# Patient Record
Sex: Female | Born: 1935
Health system: Southern US, Community
[De-identification: ages and names within clinical notes are randomized; demographics above are authoritative.]

## PROBLEM LIST (undated history)

## (undated) DIAGNOSIS — I82409 Acute embolism and thrombosis of unspecified deep veins of unspecified lower extremity: Secondary | ICD-10-CM

## (undated) DIAGNOSIS — R221 Localized swelling, mass and lump, neck: Secondary | ICD-10-CM

## (undated) DIAGNOSIS — M1990 Unspecified osteoarthritis, unspecified site: Secondary | ICD-10-CM

## (undated) DIAGNOSIS — E785 Hyperlipidemia, unspecified: Secondary | ICD-10-CM

## (undated) DIAGNOSIS — I10 Essential (primary) hypertension: Secondary | ICD-10-CM

## (undated) DIAGNOSIS — F0390 Unspecified dementia without behavioral disturbance: Secondary | ICD-10-CM

## (undated) DIAGNOSIS — E119 Type 2 diabetes mellitus without complications: Secondary | ICD-10-CM

## (undated) DIAGNOSIS — Z87898 Personal history of other specified conditions: Secondary | ICD-10-CM

## (undated) HISTORY — DX: Unspecified osteoarthritis, unspecified site: M19.90

## (undated) HISTORY — DX: Type 2 diabetes mellitus without complications: E11.9

## (undated) HISTORY — DX: Hyperlipidemia, unspecified: E78.5

## (undated) HISTORY — DX: Acute embolism and thrombosis of unspecified deep veins of unspecified lower extremity: I82.409

## (undated) HISTORY — DX: Essential (primary) hypertension: I10

---

## 1972-12-13 ENCOUNTER — Encounter (INDEPENDENT_AMBULATORY_CARE_PROVIDER_SITE_OTHER): Payer: Self-pay | Admitting: Internal Medicine

## 1999-07-14 LAB — HM MAMMOGRAPHY

## 1999-11-13 ENCOUNTER — Encounter (INDEPENDENT_AMBULATORY_CARE_PROVIDER_SITE_OTHER): Payer: Self-pay | Admitting: Internal Medicine

## 2001-11-12 ENCOUNTER — Encounter (INDEPENDENT_AMBULATORY_CARE_PROVIDER_SITE_OTHER): Payer: Self-pay | Admitting: Internal Medicine

## 2001-11-12 LAB — CONVERTED CEMR LAB: Hgb A1c MFr Bld: 5.1 %

## 2002-01-02 LAB — FECAL OCCULT BLOOD, GUAIAC: Fecal Occult Blood: NEGATIVE

## 2003-06-13 ENCOUNTER — Encounter (INDEPENDENT_AMBULATORY_CARE_PROVIDER_SITE_OTHER): Payer: Self-pay | Admitting: Internal Medicine

## 2003-06-13 LAB — CONVERTED CEMR LAB: Microalbumin U total vol: 23.2 mg/L

## 2004-01-11 ENCOUNTER — Encounter (INDEPENDENT_AMBULATORY_CARE_PROVIDER_SITE_OTHER): Payer: Self-pay | Admitting: Internal Medicine

## 2004-01-11 LAB — CONVERTED CEMR LAB: Hgb A1c MFr Bld: 5.8 %

## 2004-08-12 ENCOUNTER — Encounter (INDEPENDENT_AMBULATORY_CARE_PROVIDER_SITE_OTHER): Payer: Self-pay | Admitting: Internal Medicine

## 2004-08-12 LAB — CONVERTED CEMR LAB: Hgb A1c MFr Bld: 5.9 %

## 2004-09-29 ENCOUNTER — Ambulatory Visit: Payer: Self-pay | Admitting: Family Medicine

## 2004-11-12 ENCOUNTER — Encounter (INDEPENDENT_AMBULATORY_CARE_PROVIDER_SITE_OTHER): Payer: Self-pay | Admitting: Internal Medicine

## 2004-11-12 DIAGNOSIS — I82409 Acute embolism and thrombosis of unspecified deep veins of unspecified lower extremity: Secondary | ICD-10-CM

## 2004-11-12 HISTORY — DX: Acute embolism and thrombosis of unspecified deep veins of unspecified lower extremity: I82.409

## 2004-11-17 ENCOUNTER — Ambulatory Visit: Payer: Self-pay | Admitting: Internal Medicine

## 2004-11-20 ENCOUNTER — Emergency Department (HOSPITAL_COMMUNITY): Admission: EM | Admit: 2004-11-20 | Discharge: 2004-11-20 | Payer: Self-pay | Admitting: Emergency Medicine

## 2004-11-22 ENCOUNTER — Ambulatory Visit: Payer: Self-pay | Admitting: Family Medicine

## 2004-12-03 HISTORY — PX: OTHER SURGICAL HISTORY: SHX169

## 2004-12-04 ENCOUNTER — Ambulatory Visit: Payer: Self-pay | Admitting: Infectious Diseases

## 2004-12-04 ENCOUNTER — Ambulatory Visit: Payer: Self-pay | Admitting: Internal Medicine

## 2004-12-04 ENCOUNTER — Inpatient Hospital Stay (HOSPITAL_COMMUNITY): Admission: EM | Admit: 2004-12-04 | Discharge: 2004-12-27 | Payer: Self-pay | Admitting: Emergency Medicine

## 2004-12-04 ENCOUNTER — Ambulatory Visit: Payer: Self-pay | Admitting: Gastroenterology

## 2004-12-22 ENCOUNTER — Ambulatory Visit: Payer: Self-pay | Admitting: Internal Medicine

## 2005-01-05 ENCOUNTER — Ambulatory Visit: Payer: Self-pay | Admitting: Family Medicine

## 2005-01-10 ENCOUNTER — Ambulatory Visit: Payer: Self-pay | Admitting: Family Medicine

## 2005-01-19 ENCOUNTER — Ambulatory Visit: Payer: Self-pay | Admitting: Family Medicine

## 2005-02-02 ENCOUNTER — Ambulatory Visit: Payer: Self-pay | Admitting: Family Medicine

## 2005-03-02 ENCOUNTER — Ambulatory Visit: Payer: Self-pay | Admitting: Family Medicine

## 2005-03-06 ENCOUNTER — Ambulatory Visit: Payer: Self-pay | Admitting: Internal Medicine

## 2005-03-30 ENCOUNTER — Ambulatory Visit: Payer: Self-pay | Admitting: Family Medicine

## 2005-04-27 ENCOUNTER — Ambulatory Visit: Payer: Self-pay | Admitting: Family Medicine

## 2005-05-11 ENCOUNTER — Ambulatory Visit: Payer: Self-pay | Admitting: Family Medicine

## 2005-05-16 IMAGING — CR DG CHEST 1V PORT
1 series · 1 of 1 positions shown · non-contrast
Comparison: 12/03/04.

CLINICAL DATA: PICC line placement.  Hypertension.  
 PORTABLE CHEST:

[view not recorded]
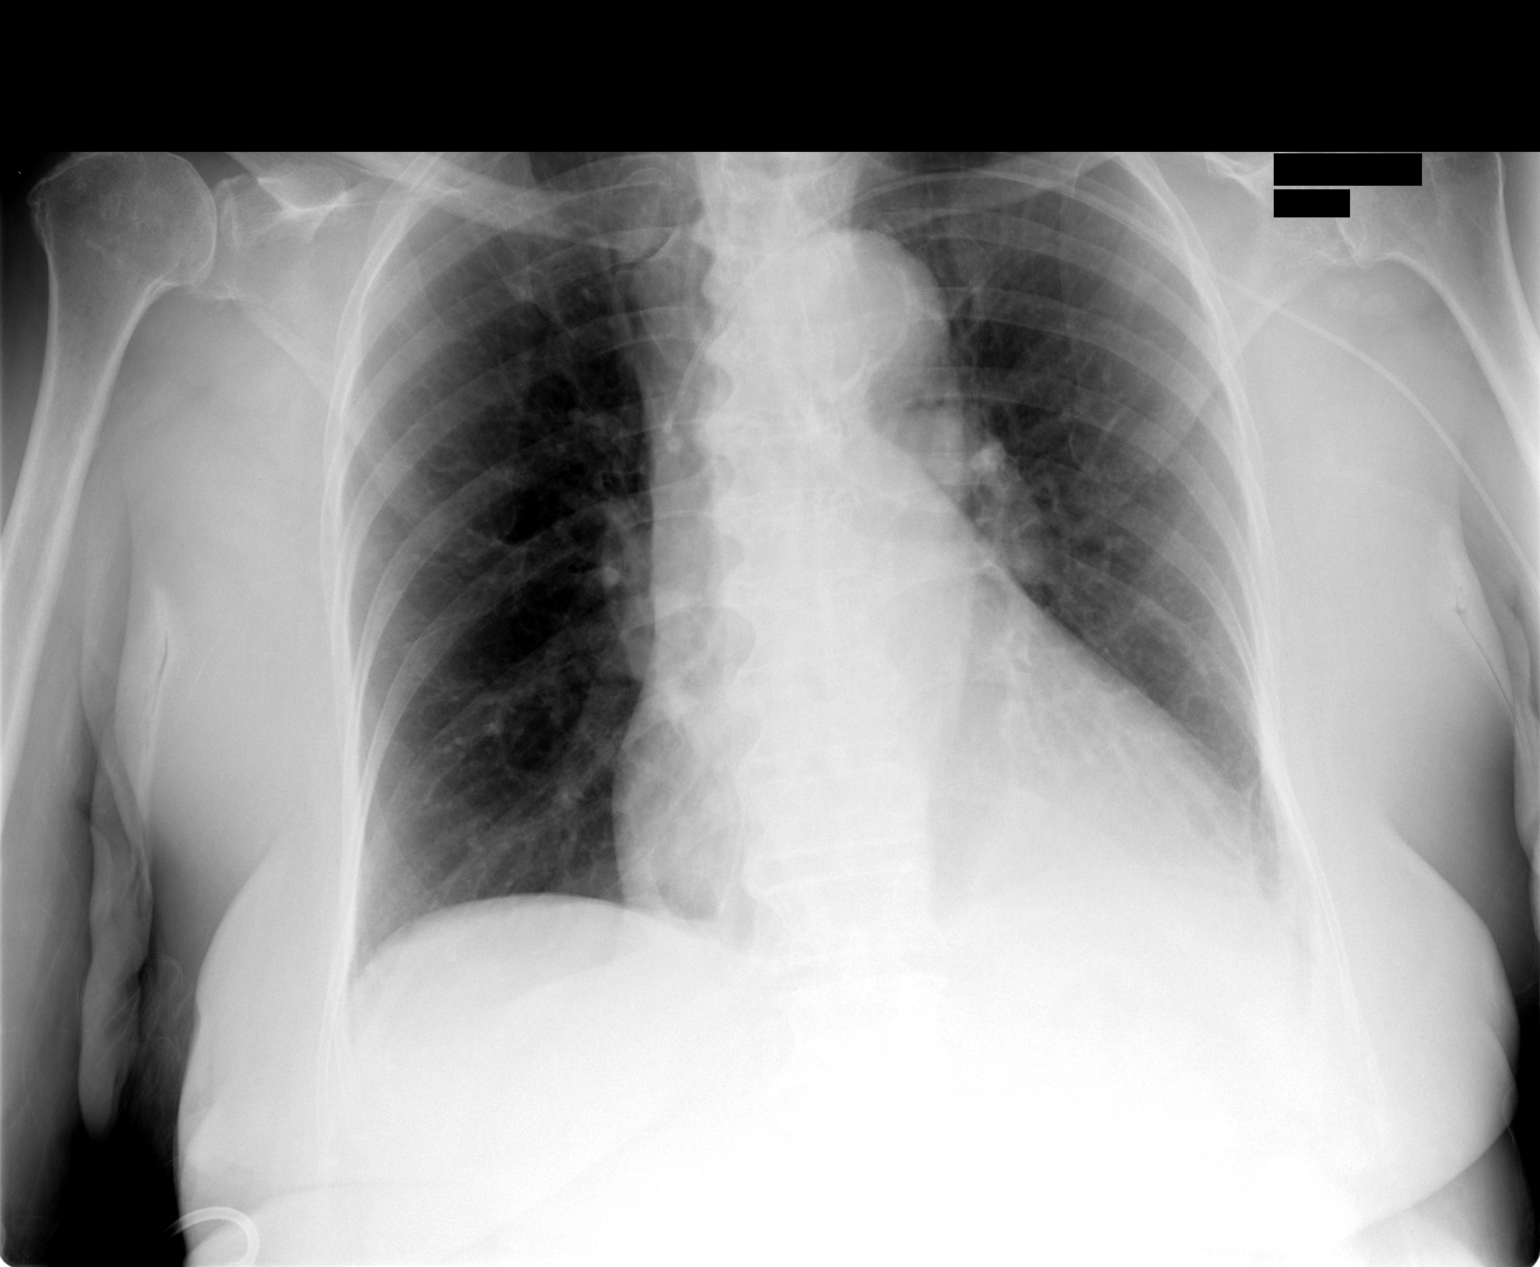

[1 of 1 positions shown; findings below may reference images not displayed]

Left-sided PICC line noted with tip overlying the upper SVC.  Cardiomegaly and left basilar atelectasis/airspace disease noted.  Lungs otherwise clear.  Degenerative changes of the shoulder are stable.
IMPRESSION: 1.  Left-sided PICC line.  Tip overlying the upper SVC.
 2.  Mild left basilar atelectasis/airspace disease.

## 2005-05-22 IMAGING — CT CT ABDOMEN W/ CM
1 of 2 series · 15 of 32 positions shown, 19 images · IV contrast (GASTROGRAFIN & [ID] OMNI 300)
Comparison: 12/11/2003.

CLINICAL DATA: Assess liver abscess.
TECHNIQUE: CT scan of the abdomen and pelvis with intravenous and oral contrast.  100 cc Omnipaque 300 was utilized.

[Series 2: routine abdomen · axial · 0.70mm/px · z∈[-420,-65]mm · 15 of 79 slices shown, 19 images]
[im 4/79  soft-tissue]
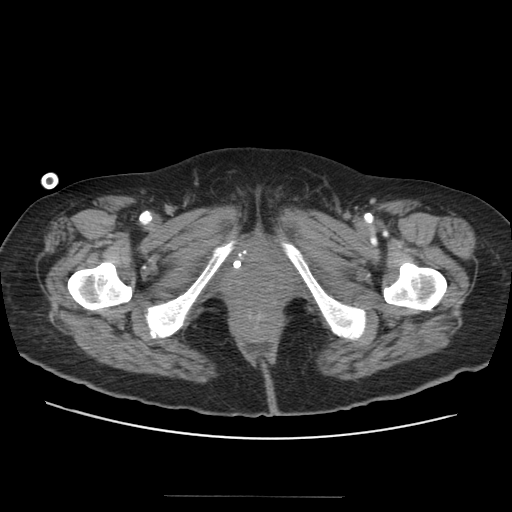
[im 4/79  bone]
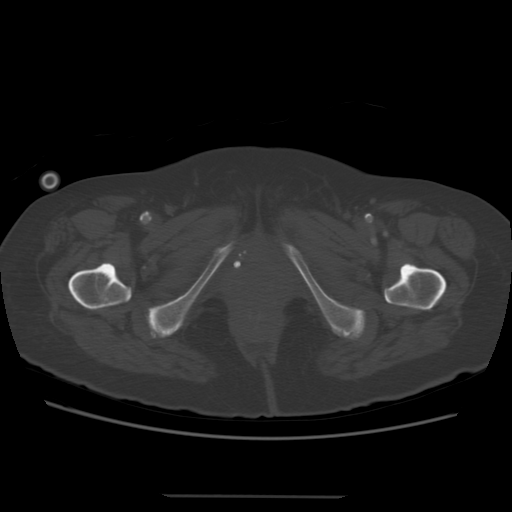
[im 11/79  soft-tissue]
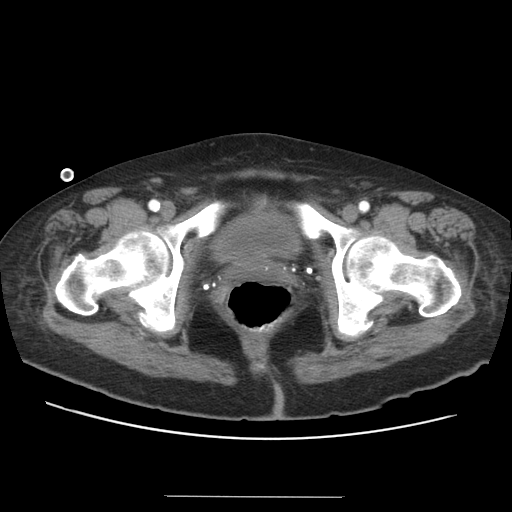
[im 17/79  soft-tissue]
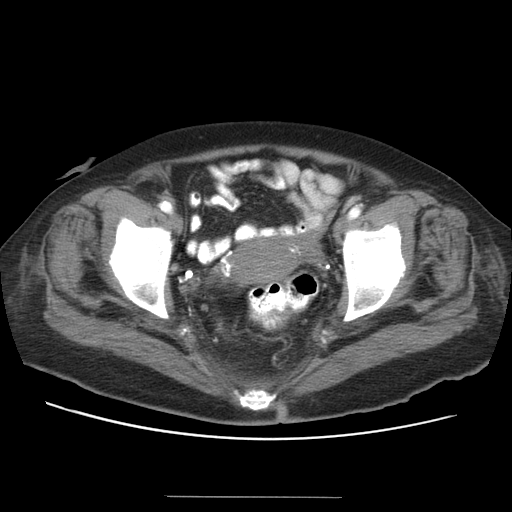
[im 21/79  soft-tissue]
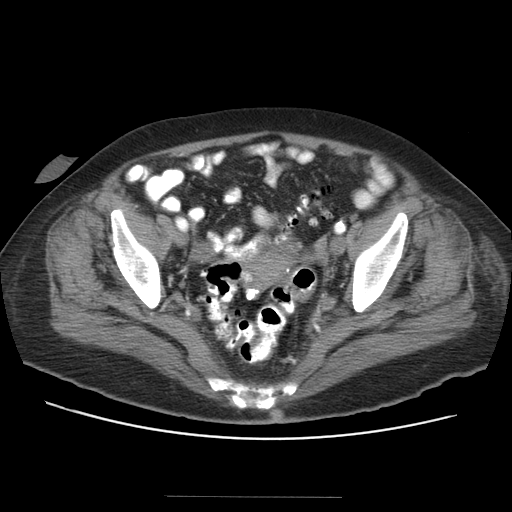
[im 28/79  soft-tissue]
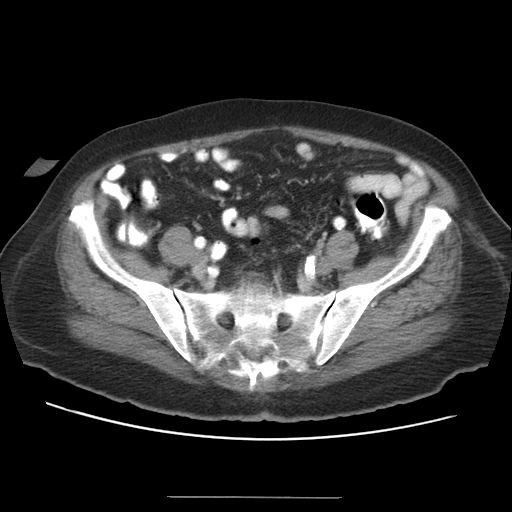
[im 34/79  soft-tissue]
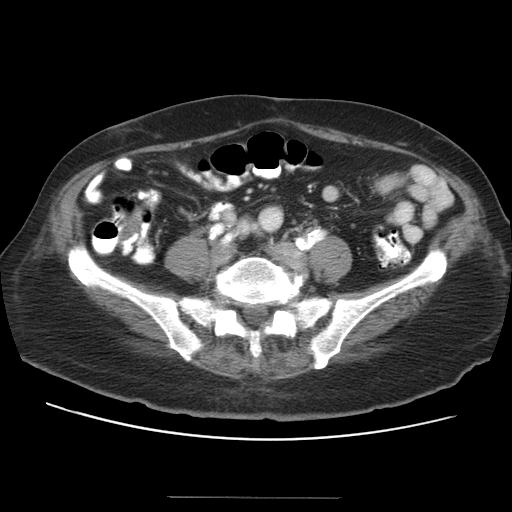
[im 41/79  soft-tissue]
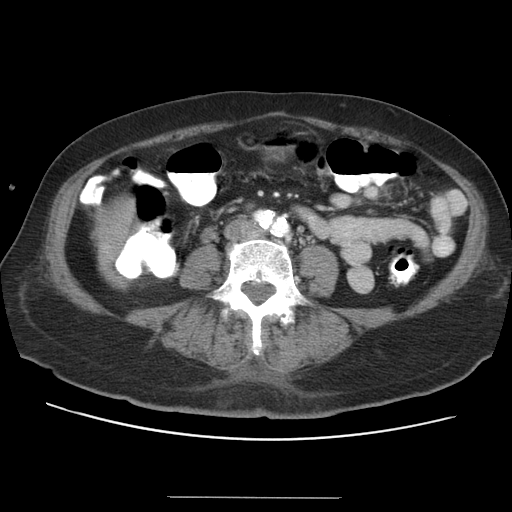
[im 45/79  soft-tissue]
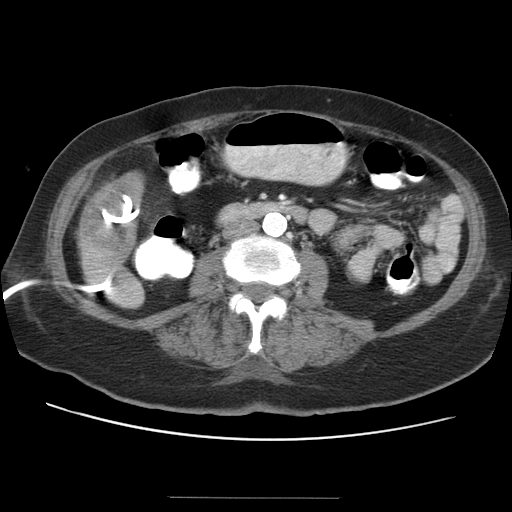
[im 51/79  soft-tissue]
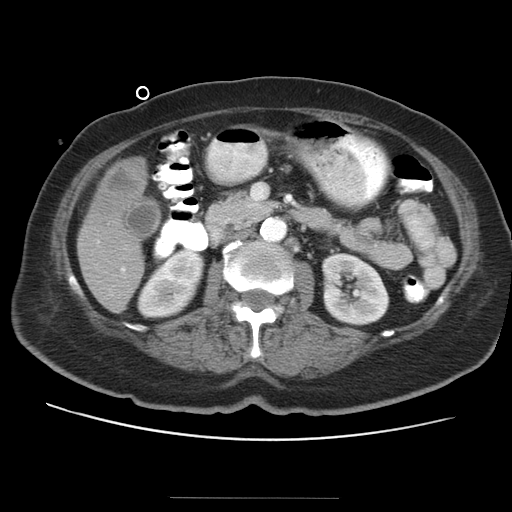
[im 51/79  bone]
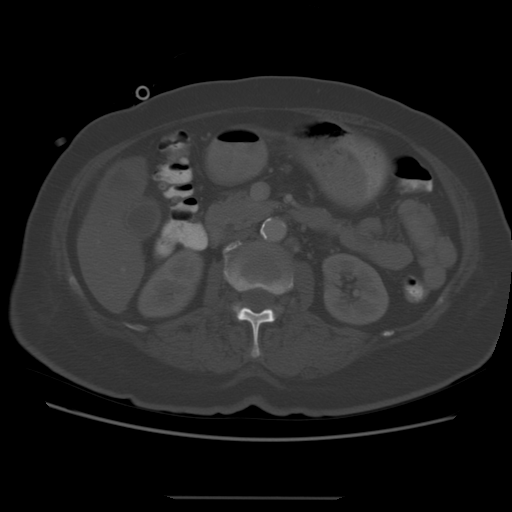
[im 58/79  soft-tissue]
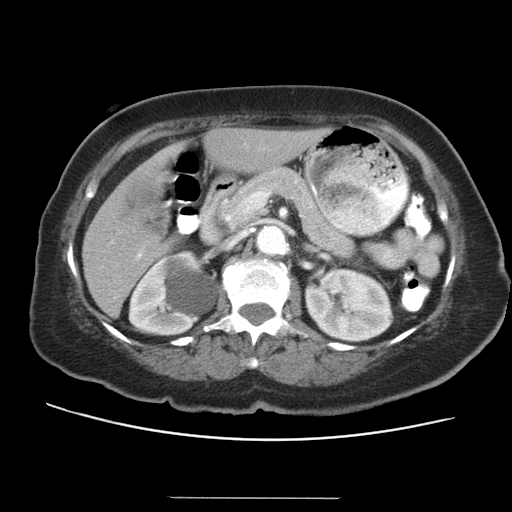
[im 62/79  soft-tissue]
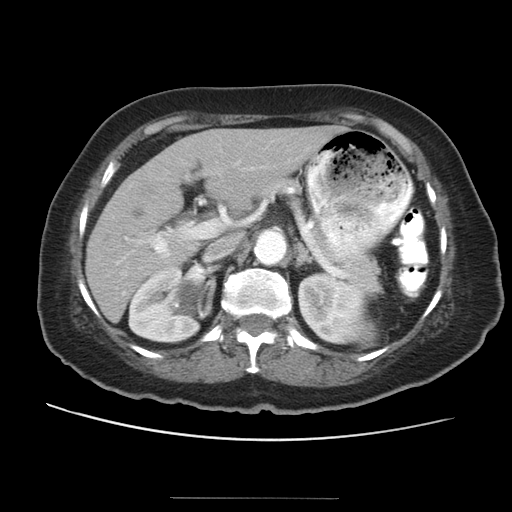
[im 65/79  lung]
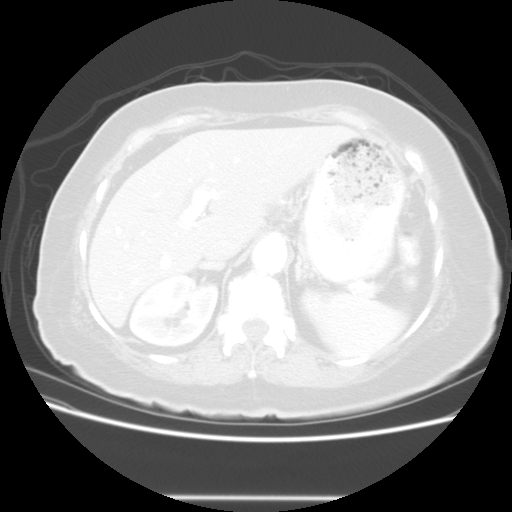
[im 68/79  soft-tissue]
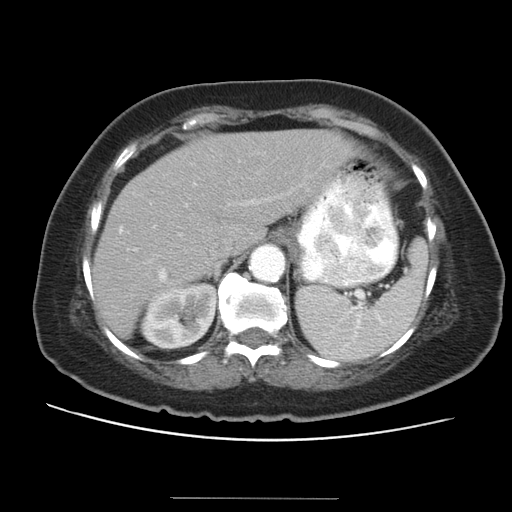
[im 68/79  lung]
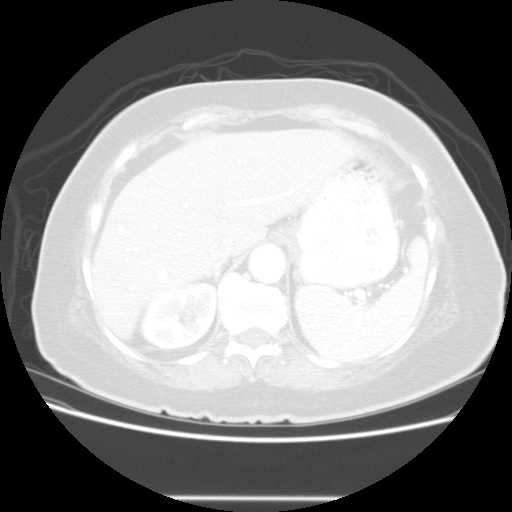
[im 72/79  lung]
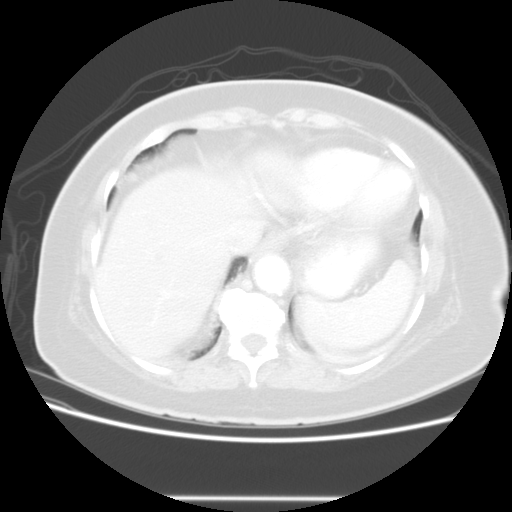
[im 75/79  soft-tissue]
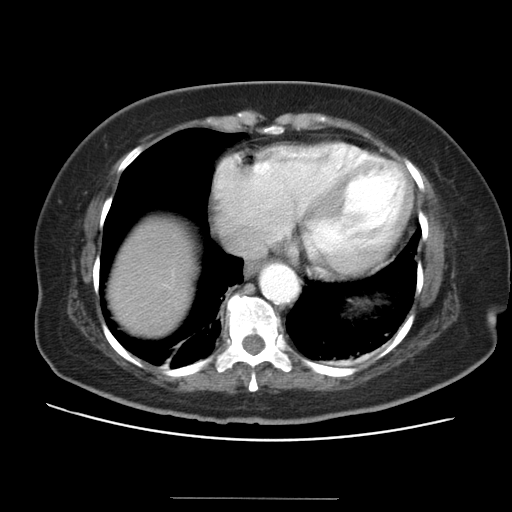
[im 75/79  lung]
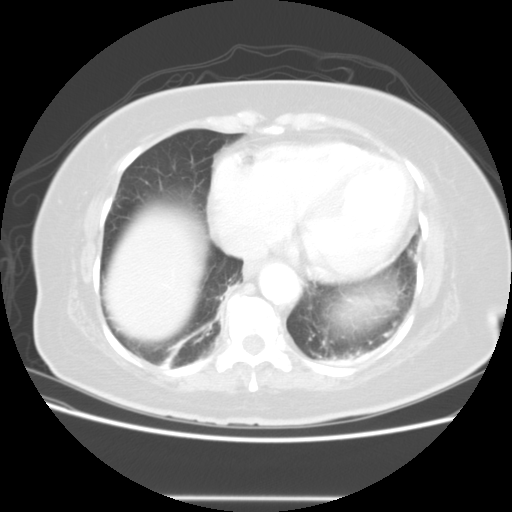

[15 of 32 positions shown; findings below may reference images not displayed]

ABDOMEN CT WITH CONTRAST:
A pigtail catheter has entered a complex cystic structure along the inferior aspect of the right lobe of the liver.  The hypodensity remaining within the inferior aspect of the right lobe of the liver appears to have slightly less volume with overall dimensions presently 7.4 x 4.2 cm versus prior 7.6 x 5.2 cm.  Within this region, fluid and air collection anteriorly now measures 2.2 x 2 cm whereas previously this predominantly contained fluid and measured 3.4 x 2.2 cm. Remainder of findings without change.
IMPRESSION: Slight decrease in size of complex fluid collection along the inferior aspect of the right lobe of the liver as described above.  
PELVIS CT WITH CONTRAST: 
No significant change.  There remains significant sigmoid diverticulosis type changes without CT evidence of diverticulitis.
IMPRESSION: No significant change.

## 2005-06-08 ENCOUNTER — Ambulatory Visit: Payer: Self-pay | Admitting: Family Medicine

## 2005-06-22 ENCOUNTER — Ambulatory Visit: Payer: Self-pay | Admitting: Family Medicine

## 2005-07-06 ENCOUNTER — Ambulatory Visit: Payer: Self-pay | Admitting: Family Medicine

## 2005-08-03 ENCOUNTER — Ambulatory Visit: Payer: Self-pay | Admitting: Family Medicine

## 2005-08-10 ENCOUNTER — Ambulatory Visit: Payer: Self-pay | Admitting: Family Medicine

## 2005-08-24 ENCOUNTER — Ambulatory Visit: Payer: Self-pay | Admitting: Family Medicine

## 2005-09-21 ENCOUNTER — Ambulatory Visit: Payer: Self-pay | Admitting: Family Medicine

## 2005-10-19 ENCOUNTER — Ambulatory Visit: Payer: Self-pay | Admitting: Family Medicine

## 2005-11-16 ENCOUNTER — Ambulatory Visit: Payer: Self-pay | Admitting: Family Medicine

## 2005-12-14 ENCOUNTER — Ambulatory Visit: Payer: Self-pay | Admitting: Family Medicine

## 2005-12-21 ENCOUNTER — Ambulatory Visit: Payer: Self-pay | Admitting: Family Medicine

## 2006-01-04 ENCOUNTER — Ambulatory Visit: Payer: Self-pay | Admitting: Family Medicine

## 2006-01-18 ENCOUNTER — Ambulatory Visit: Payer: Self-pay | Admitting: Family Medicine

## 2006-02-18 ENCOUNTER — Ambulatory Visit: Payer: Self-pay | Admitting: Family Medicine

## 2006-03-18 ENCOUNTER — Ambulatory Visit: Payer: Self-pay | Admitting: Family Medicine

## 2006-04-12 DIAGNOSIS — K5730 Diverticulosis of large intestine without perforation or abscess without bleeding: Secondary | ICD-10-CM | POA: Insufficient documentation

## 2006-04-12 DIAGNOSIS — K449 Diaphragmatic hernia without obstruction or gangrene: Secondary | ICD-10-CM | POA: Insufficient documentation

## 2006-04-12 HISTORY — PX: ESOPHAGOGASTRODUODENOSCOPY: SHX1529

## 2006-04-15 ENCOUNTER — Ambulatory Visit: Payer: Self-pay | Admitting: Family Medicine

## 2006-05-01 ENCOUNTER — Ambulatory Visit: Payer: Self-pay | Admitting: Family Medicine

## 2006-05-02 ENCOUNTER — Ambulatory Visit: Payer: Self-pay | Admitting: Internal Medicine

## 2006-05-06 ENCOUNTER — Ambulatory Visit: Payer: Self-pay | Admitting: Internal Medicine

## 2006-05-06 ENCOUNTER — Encounter (INDEPENDENT_AMBULATORY_CARE_PROVIDER_SITE_OTHER): Payer: Self-pay | Admitting: Specialist

## 2006-05-16 ENCOUNTER — Ambulatory Visit: Payer: Self-pay | Admitting: Family Medicine

## 2006-06-28 ENCOUNTER — Ambulatory Visit: Payer: Self-pay | Admitting: Family Medicine

## 2006-07-30 ENCOUNTER — Ambulatory Visit: Payer: Self-pay | Admitting: Family Medicine

## 2006-08-29 ENCOUNTER — Ambulatory Visit: Payer: Self-pay | Admitting: Family Medicine

## 2006-09-30 ENCOUNTER — Ambulatory Visit: Payer: Self-pay | Admitting: Family Medicine

## 2007-01-03 ENCOUNTER — Ambulatory Visit: Payer: Self-pay | Admitting: Family Medicine

## 2007-01-03 LAB — CONVERTED CEMR LAB
BUN: 14 mg/dL (ref 6–23)
Creatinine,U: 145.8 mg/dL
GFR calc Af Amer: 122 mL/min
Microalb, Ur: 1.2 mg/dL (ref 0.0–1.9)
Potassium: 3.9 meq/L (ref 3.5–5.1)
Total CHOL/HDL Ratio: 2.7
Triglycerides: 71 mg/dL (ref 0–149)
VLDL: 14 mg/dL (ref 0–40)

## 2007-02-11 ENCOUNTER — Encounter (INDEPENDENT_AMBULATORY_CARE_PROVIDER_SITE_OTHER): Payer: Self-pay | Admitting: Internal Medicine

## 2007-02-11 DIAGNOSIS — M1710 Unilateral primary osteoarthritis, unspecified knee: Secondary | ICD-10-CM

## 2007-02-11 DIAGNOSIS — E785 Hyperlipidemia, unspecified: Secondary | ICD-10-CM | POA: Insufficient documentation

## 2007-02-11 DIAGNOSIS — F411 Generalized anxiety disorder: Secondary | ICD-10-CM | POA: Insufficient documentation

## 2007-02-11 DIAGNOSIS — I10 Essential (primary) hypertension: Secondary | ICD-10-CM

## 2007-04-03 ENCOUNTER — Ambulatory Visit: Payer: Self-pay | Admitting: Family Medicine

## 2007-08-04 ENCOUNTER — Ambulatory Visit: Payer: Self-pay | Admitting: Family Medicine

## 2007-08-07 ENCOUNTER — Ambulatory Visit: Payer: Self-pay | Admitting: Family Medicine

## 2007-08-12 LAB — CONVERTED CEMR LAB
Calcium: 9.5 mg/dL (ref 8.4–10.5)
Chloride: 107 meq/L (ref 96–112)
GFR calc Af Amer: 87 mL/min
GFR calc non Af Amer: 72 mL/min
HDL: 75.6 mg/dL (ref >39.0)
Hgb A1c MFr Bld: 6.5 % — ABNORMAL HIGH (ref 4.6–6.0)
LDL Cholesterol: 98 mg/dL (ref 0–99)
Sodium: 145 meq/L (ref 135–145)
VLDL: 9 mg/dL (ref 0–40)

## 2007-09-08 ENCOUNTER — Ambulatory Visit: Payer: Self-pay | Admitting: Family Medicine

## 2007-11-11 ENCOUNTER — Ambulatory Visit: Payer: Self-pay | Admitting: Family Medicine

## 2007-11-19 ENCOUNTER — Ambulatory Visit: Payer: Self-pay | Admitting: Family Medicine

## 2007-12-22 ENCOUNTER — Ambulatory Visit: Payer: Self-pay | Admitting: Family Medicine

## 2007-12-25 ENCOUNTER — Ambulatory Visit: Payer: Self-pay | Admitting: Family Medicine

## 2007-12-29 LAB — CONVERTED CEMR LAB
ALT: 12 units/L (ref 0–35)
AST: 16 units/L (ref 0–37)
BUN: 31 mg/dL — ABNORMAL HIGH (ref 6–23)
Calcium: 9.6 mg/dL (ref 8.4–10.5)
Chloride: 108 meq/L (ref 96–112)
Cholesterol: 155 mg/dL (ref 0–200)
GFR calc non Af Amer: 25 mL/min
Glucose, Bld: 126 mg/dL — ABNORMAL HIGH (ref 70–99)
Hgb A1c MFr Bld: 6.3 % — ABNORMAL HIGH (ref 4.6–6.0)
LDL Cholesterol: 85 mg/dL (ref 0–99)

## 2008-03-29 ENCOUNTER — Ambulatory Visit: Payer: Self-pay | Admitting: Family Medicine

## 2008-03-29 DIAGNOSIS — N259 Disorder resulting from impaired renal tubular function, unspecified: Secondary | ICD-10-CM | POA: Insufficient documentation

## 2008-04-01 ENCOUNTER — Telehealth (INDEPENDENT_AMBULATORY_CARE_PROVIDER_SITE_OTHER): Payer: Self-pay | Admitting: Internal Medicine

## 2008-04-06 ENCOUNTER — Telehealth (INDEPENDENT_AMBULATORY_CARE_PROVIDER_SITE_OTHER): Payer: Self-pay | Admitting: Internal Medicine

## 2008-04-15 ENCOUNTER — Telehealth (INDEPENDENT_AMBULATORY_CARE_PROVIDER_SITE_OTHER): Payer: Self-pay | Admitting: Internal Medicine

## 2008-04-16 ENCOUNTER — Ambulatory Visit: Payer: Self-pay | Admitting: Internal Medicine

## 2008-04-20 ENCOUNTER — Telehealth (INDEPENDENT_AMBULATORY_CARE_PROVIDER_SITE_OTHER): Payer: Self-pay | Admitting: Internal Medicine

## 2008-04-20 LAB — CONVERTED CEMR LAB
CO2: 29 meq/L (ref 19–32)
Calcium: 9.3 mg/dL (ref 8.4–10.5)
Creatinine, Ser: 1.3 mg/dL — ABNORMAL HIGH (ref 0.4–1.2)
GFR calc Af Amer: 50 mL/min
GFR calc non Af Amer: 41 mL/min
Sodium: 139 meq/L (ref 135–145)

## 2008-07-01 ENCOUNTER — Ambulatory Visit: Payer: Self-pay | Admitting: Family Medicine

## 2008-07-30 ENCOUNTER — Telehealth (INDEPENDENT_AMBULATORY_CARE_PROVIDER_SITE_OTHER): Payer: Self-pay | Admitting: Internal Medicine

## 2008-09-22 ENCOUNTER — Ambulatory Visit: Payer: Self-pay | Admitting: Family Medicine

## 2008-09-23 LAB — CONVERTED CEMR LAB
AST: 29 units/L (ref 0–37)
BUN: 15 mg/dL (ref 6–23)
CO2: 30 meq/L (ref 19–32)
Chloride: 105 meq/L (ref 96–112)
Cholesterol: 164 mg/dL (ref 0–200)
Creatinine, Ser: 1.1 mg/dL (ref 0.4–1.2)
Glucose, Bld: 133 mg/dL — ABNORMAL HIGH (ref 70–99)
Hgb A1c MFr Bld: 6.1 % — ABNORMAL HIGH (ref 4.6–6.0)
Microalb Creat Ratio: 9.7 mg/g (ref 0.0–30.0)
Potassium: 3.7 meq/L (ref 3.5–5.1)
Triglycerides: 40 mg/dL (ref 0–149)

## 2008-09-29 ENCOUNTER — Ambulatory Visit: Payer: Self-pay | Admitting: Family Medicine

## 2009-01-25 ENCOUNTER — Ambulatory Visit: Payer: Self-pay | Admitting: Family Medicine

## 2009-01-28 LAB — CONVERTED CEMR LAB
BUN: 17 mg/dL (ref 6–23)
Calcium: 9.5 mg/dL (ref 8.4–10.5)
Creatinine,U: 74.2 mg/dL
GFR calc non Af Amer: 60.02 mL/min (ref >60)
Hgb A1c MFr Bld: 6.4 % (ref 4.6–6.5)
Microalb Creat Ratio: 2.7 mg/g (ref 0.0–30.0)
Potassium: 3.5 meq/L (ref 3.5–5.1)
Sodium: 142 meq/L (ref 135–145)

## 2009-05-23 ENCOUNTER — Telehealth: Payer: Self-pay | Admitting: Family Medicine

## 2009-05-31 ENCOUNTER — Ambulatory Visit: Payer: Self-pay | Admitting: Family Medicine

## 2009-06-02 LAB — CONVERTED CEMR LAB
ALT: 20 units/L (ref 0–35)
AST: 28 units/L (ref 0–37)
Calcium: 9.5 mg/dL (ref 8.4–10.5)
GFR calc non Af Amer: 66.95 mL/min (ref >60)
Glucose, Bld: 121 mg/dL — ABNORMAL HIGH (ref 70–99)
HDL: 80.9 mg/dL (ref >39.00)
Potassium: 3.5 meq/L (ref 3.5–5.1)
Sodium: 144 meq/L (ref 135–145)
Total CHOL/HDL Ratio: 2
VLDL: 14.8 mg/dL (ref 0.0–40.0)

## 2009-10-04 ENCOUNTER — Ambulatory Visit: Payer: Self-pay | Admitting: Family Medicine

## 2009-10-11 ENCOUNTER — Ambulatory Visit: Payer: Self-pay | Admitting: Family Medicine

## 2009-10-13 LAB — CONVERTED CEMR LAB
ALT: 19 units/L (ref 0–35)
AST: 26 units/L (ref 0–37)
CO2: 31 meq/L (ref 19–32)
Calcium: 9.4 mg/dL (ref 8.4–10.5)
Cholesterol: 179 mg/dL (ref 0–200)
Creatinine, Ser: 1.1 mg/dL (ref 0.4–1.2)
GFR calc non Af Amer: 59.93 mL/min (ref >60)
Hgb A1c MFr Bld: 6.3 % (ref 4.6–6.5)
Sodium: 143 meq/L (ref 135–145)

## 2009-11-18 ENCOUNTER — Telehealth (INDEPENDENT_AMBULATORY_CARE_PROVIDER_SITE_OTHER): Payer: Self-pay | Admitting: Internal Medicine

## 2010-03-16 ENCOUNTER — Telehealth: Payer: Self-pay | Admitting: Family Medicine

## 2010-03-28 ENCOUNTER — Ambulatory Visit: Payer: Self-pay | Admitting: Internal Medicine

## 2010-03-29 LAB — CONVERTED CEMR LAB
AST: 29 units/L (ref 0–37)
Albumin: 4.3 g/dL (ref 3.5–5.2)
BUN: 17 mg/dL (ref 6–23)
Calcium: 9.7 mg/dL (ref 8.4–10.5)
Cholesterol: 175 mg/dL (ref 0–200)
Creatinine, Ser: 1 mg/dL (ref 0.4–1.2)
GFR calc non Af Amer: 66.83 mL/min (ref >60)
Glucose, Bld: 124 mg/dL — ABNORMAL HIGH (ref 70–99)
HDL: 84.5 mg/dL (ref >39.00)
Hgb A1c MFr Bld: 6.3 % (ref 4.6–6.5)
LDL Cholesterol: 73 mg/dL (ref 0–99)
Potassium: 3.9 meq/L (ref 3.5–5.1)
Total Bilirubin: 0.8 mg/dL (ref 0.3–1.2)
Triglycerides: 86 mg/dL (ref 0.0–149.0)
VLDL: 17.2 mg/dL (ref 0.0–40.0)

## 2010-04-04 ENCOUNTER — Ambulatory Visit: Payer: Self-pay | Admitting: Internal Medicine

## 2010-04-04 DIAGNOSIS — M1990 Unspecified osteoarthritis, unspecified site: Secondary | ICD-10-CM | POA: Insufficient documentation

## 2010-04-04 DIAGNOSIS — E119 Type 2 diabetes mellitus without complications: Secondary | ICD-10-CM | POA: Insufficient documentation

## 2010-04-04 DIAGNOSIS — F0150 Vascular dementia without behavioral disturbance: Secondary | ICD-10-CM

## 2010-04-21 ENCOUNTER — Telehealth: Payer: Self-pay | Admitting: Internal Medicine

## 2010-05-29 ENCOUNTER — Telehealth (INDEPENDENT_AMBULATORY_CARE_PROVIDER_SITE_OTHER): Payer: Self-pay | Admitting: *Deleted

## 2010-07-31 ENCOUNTER — Ambulatory Visit: Payer: Self-pay | Admitting: Internal Medicine

## 2010-07-31 ENCOUNTER — Telehealth (INDEPENDENT_AMBULATORY_CARE_PROVIDER_SITE_OTHER): Payer: Self-pay | Admitting: *Deleted

## 2010-09-28 ENCOUNTER — Telehealth: Payer: Self-pay | Admitting: Internal Medicine

## 2010-11-21 ENCOUNTER — Ambulatory Visit
Admission: RE | Admit: 2010-11-21 | Discharge: 2010-11-21 | Payer: Self-pay | Source: Home / Self Care | Attending: Internal Medicine | Admitting: Internal Medicine

## 2010-11-21 ENCOUNTER — Other Ambulatory Visit: Payer: Self-pay | Admitting: Internal Medicine

## 2010-11-21 LAB — HEPATIC FUNCTION PANEL
ALT: 16 U/L (ref 0–35)
AST: 26 U/L (ref 0–37)
Albumin: 4 g/dL (ref 3.5–5.2)
Alkaline Phosphatase: 57 U/L (ref 39–117)
Bilirubin, Direct: 0.2 mg/dL (ref 0.0–0.3)
Total Bilirubin: 0.9 mg/dL (ref 0.3–1.2)
Total Protein: 7.2 g/dL (ref 6.0–8.3)

## 2010-11-21 LAB — CBC WITH DIFFERENTIAL/PLATELET
Basophils Absolute: 0.1 10*3/uL (ref 0.0–0.1)
Basophils Relative: 1.6 % (ref 0.0–3.0)
Eosinophils Absolute: 0.1 10*3/uL (ref 0.0–0.7)
Eosinophils Relative: 0.9 % (ref 0.0–5.0)
HCT: 39.8 % (ref 36.0–46.0)
Hemoglobin: 13.5 g/dL (ref 12.0–15.0)
Lymphocytes Relative: 24.1 % (ref 12.0–46.0)
Lymphs Abs: 1.7 10*3/uL (ref 0.7–4.0)
MCHC: 34 g/dL (ref 30.0–36.0)
MCV: 96.1 fl (ref 78.0–100.0)
Monocytes Absolute: 0.7 10*3/uL (ref 0.1–1.0)
Monocytes Relative: 9.3 % (ref 3.0–12.0)
Neutro Abs: 4.6 10*3/uL (ref 1.4–7.7)
Neutrophils Relative %: 64.1 % (ref 43.0–77.0)
Platelets: 161 10*3/uL (ref 150.0–400.0)
RBC: 4.14 Mil/uL (ref 3.87–5.11)
RDW: 16.3 % — ABNORMAL HIGH (ref 11.5–14.6)
WBC: 7.1 10*3/uL (ref 4.5–10.5)

## 2010-11-21 LAB — RENAL FUNCTION PANEL
Albumin: 4 g/dL (ref 3.5–5.2)
BUN: 21 mg/dL (ref 6–23)
CO2: 30 mEq/L (ref 19–32)
Calcium: 9.5 mg/dL (ref 8.4–10.5)
Chloride: 102 mEq/L (ref 96–112)
Creatinine, Ser: 1 mg/dL (ref 0.4–1.2)
GFR: 69.95 mL/min (ref >60.00)
Glucose, Bld: 114 mg/dL — ABNORMAL HIGH (ref 70–99)
Phosphorus: 2.6 mg/dL (ref 2.3–4.6)
Potassium: 3.5 mEq/L (ref 3.5–5.1)
Sodium: 143 mEq/L (ref 135–145)

## 2010-11-21 LAB — TSH: TSH: 1.97 u[IU]/mL (ref 0.35–5.50)

## 2010-11-21 LAB — HM DIABETES FOOT EXAM

## 2010-11-21 LAB — HEMOGLOBIN A1C: Hgb A1c MFr Bld: 6.4 % (ref 4.6–6.5)

## 2010-12-03 ENCOUNTER — Encounter: Payer: Self-pay | Admitting: Interventional Radiology

## 2010-12-13 NOTE — Progress Notes (Signed)
Summary: refill request for tramadol  Phone Note Refill Request Message from:  Fax from Pharmacy  Refills Requested: Medication #1:  TRAMADOL HCL 50 MG TABS 1 tab by mouth three times a day as needed for knee pain   Last Refilled: 05/01/2010 Faxed request from [facility] is on your desk.  Initial call taken by: Lowella Petties CMA, AAMA,  September 28, 2010 11:35 AM    Prescriptions: TRAMADOL HCL 50 MG TABS (TRAMADOL HCL) 1 tab by mouth three times a day as needed for knee pain  #90 Tablet x 0   Entered by:   Mervin Hack CMA (AAMA)   Authorized by:   Cindee Salt MD   Signed by:   Mervin Hack CMA (AAMA) on 09/28/2010   Method used:   [facility]ron[facility] to        [facility] [phone]       [address]       [place], [place]  [address]       Ph: [phone]       Fax: [phone]   RxID:   [id number]

## 2010-12-13 NOTE — Progress Notes (Signed)
Summary: Rx Tramadol  Phone Note Refill Request Call back at [phone] Message from:  [facility] on Mar 16, 2010 8:54 AM  Refills Requested: Medication #1:  TRAMADOL HCL 50 MG TABS 1 every 6 hours as needed pain in knees   Last Refilled: 12/19/2009 Received faxed refill request, patient last seen 09/2009 by Billie.  Patient has an appt with Dr. Alphonsus Sias this month, however Dr. Alphonsus Sias will be out of the office until Monday.  Please advise.   Method Requested: Telephone to Pharmacy Initial call taken by: Linde Gillis CMA Duncan Dull),  Mar 16, 2010 8:55 AM  Follow-up for Phone Call        Rx called to pharmacy Follow-up by: Benny Lennert CMA Duncan Dull),  Mar 16, 2010 9:43 AM    Prescriptions: TRAMADOL HCL 50 MG TABS (TRAMADOL HCL) 1 every 6 hours as needed pain in knees  #50 x 0   Entered and Authorized by:   Hannah Beat MD   Signed by:   Hannah Beat MD on 03/16/2010   Method used:   Telephoned to ...       [facility] [address] [id number]       [address]       [place], [place]  [address]       Ph: [phone]       Fax: [phone]   RxID:   [id number]

## 2010-12-13 NOTE — Progress Notes (Signed)
Summary: Hydralazine clarification  Phone Note Refill Request Call back at [phone] Message from:  [facility] on November 18, 2009 11:32 AM  Refills Requested: Medication #1:  HYDRALAZINE HCL 50 MG TABS Take 1 by mouth two times a day. [facility] refill on Hydralazine 50 mg two tabs twice a day (no date given on last refill) The med list has Hydralazine 50mg  one tablet twice a day. In the notes it looks like on 01/25/09 visit the Hydralazine was changed from 50mg  two tabs twice a day to one tab twice a day. I have tried pt's home # and alternate family # with no answer but left v/m for a call back. Please advise.    Method Requested: Electronic Initial call taken by: Lewanda Rife LPN,  November 18, 2009 11:36 AM  Follow-up for Phone Call        the one the drug store called in is old--needs to cancel that Rx and use new one I sent in just now   Billie-Lynn Tyler Deis FNP  November 18, 2009 3:06 PM   Additional Follow-up for Phone Call Additional follow up Details #1::        Pharmacist filled new script and it has been picked up by the pt's daughter. Additional Follow-up by: Lowella Petties CMA,  November 18, 2009 4:47 PM    Prescriptions: HYDRALAZINE HCL 50 MG TABS (HYDRALAZINE HCL) Take 1 by mouth two times a day  #60 x 6   Entered and Authorized by:   Gildardo Griffes FNP   Signed by:   Gildardo Griffes FNP on 11/18/2009   Method used:   Electronically to        [facility] [address] [phone]* (retail)       [address]       [place], [place]  [address]       Ph: [phone]       Fax: [phone]   RxID:   [id number]

## 2010-12-13 NOTE — Assessment & Plan Note (Signed)
Summary: 4 m f/u dlo   Vital Signs:  Patient profile:   77 year old female Weight:      141 pounds Temp:     97.6 degrees F oral Pulse rate:   80 / minute Pulse rhythm:   regular BP sitting:   138 / 70  (left arm) Cuff size:   regular  Vitals Entered By: Mervin Hack CMA Duncan Dull) (July 31, 2010 10:11 AM) CC: 4 month follow-up   History of Present Illness: Doing well Daughter brought her but didn't come into room  Plans to move in with daughter for the winter She doesn't have the money to buy oil to heat Still cooks herself Daughter helps with cleaning  and laundry  Doesn't check sugars No meds Tries to be careful with eating  No chest pain No SOB No edema  No apparent memory changes  continues to have knee pain Limits her walking Not taking the pain pills  Allergies: No Known Drug Allergies  Past History:  Past medical, surgical, family and social histories (including risk factors) reviewed for relevance to current acute and chronic problems.  Past Medical History: Reviewed history from 04/04/2010 and no changes required. Diabetes mellitus, type II Hyperlipidemia Hypertension Osteoarthritis  Past Surgical History: Reviewed history from 04/04/2010 and no changes required. [facility] - LIVER ABSCESS - STREP WITH STREP SEPSIS ------1/22 - 12/27/04          DVT DURING HOSPITAL 6/07 COLONOSCOPY - DIVERTICS, HEMHDS 6/07 EGD  Family History: Reviewed history from 04/04/2010 and no changes required. Parents died when she was very young 6 brothers---all deceased Not clear about medical history  Social History: Reviewed history from 05/29/2010 and no changes required. Retired--housekeeper at hospital Widowed  ~2001 7 children -- 5 daughters , 2 sons Never Smoked Alcohol use-no  Asks for daughter, Kathie Rhodes, to make health care decisions if she is unable to. Discussed DNR--she requests this but later decided that she wants attempts at resuscitation Probably  would accept feeding tube  Review of Systems       Sleeps well Appetite is fine weight down 5#--she wasn't aware of this. ??related to summer  Physical Exam  General:  alert and normal appearance.   Neck:  supple, no masses, no thyromegaly, no carotid bruits, and no cervical lymphadenopathy.   Lungs:  normal respiratory effort, no intercostal retractions, no accessory muscle use, and normal breath sounds.   Heart:  normal rate, regular rhythm, no murmur, and no gallop.   Abdomen:  soft and non-tender.   Msk:  no joint tenderness and no joint swelling.   Extremities:  No edema Neurologic:  alert & oriented X3 and strength normal in all extremities.   Walks with cane Doesn't know President's didn't try world backwards Psych:  normally interactive, good eye contact, not anxious appearing, and not depressed appearing.     Impression & Recommendations:  Problem # 1:  DEMENTIA (ICD-294.8) Assessment Unchanged seems mild and not worsened will be living with daughter for the winter will reassess functional status at next visit  Problem # 2:  OSTEOARTHRITIS (ICD-715.90) Assessment: Comment Only hasn't been taking the med discussed tylenol regularly  Her updated medication list for this problem includes:    Tramadol Hcl 50 Mg Tabs (Tramadol hcl) .Marland Kitchen... 1 tab by mouth three times a day as needed for knee pain  Problem # 3:  HYPERTENSION (ICD-401.9) Assessment: Unchanged good control no changes needed  Her updated medication list for this problem includes:  Amlodipine Besylate 10 Mg Tabs (Amlodipine besylate) .Marland Kitchen... Take 1 tablet by mouth once a day    Maxzide-25 37.5-25 Mg Tabs (Triamterene-hctz) .Marland Kitchen... 1 by mouth every am for bp    Hydralazine Hcl 50 Mg Tabs (Hydralazine hcl) .Marland Kitchen... Take 1 by mouth two times a day  BP today: 138/70 Prior BP: 130/60 (04/04/2010)  Labs Reviewed: K+: 3.9 (03/28/2010) Creat: : 1.0 (03/28/2010)   Chol: 175 (03/28/2010)   HDL: 84.50 (03/28/2010)    LDL: 73 (03/28/2010)   TG: 86.0 (03/28/2010)  Problem # 4:  DIABETES MELLITUS, TYPE II (ICD-250.00) Assessment: Comment Only no meds will check labs next time  Labs Reviewed: Creat: 1.0 (03/28/2010)   Microalbumin: 23.2 (06/13/2003)  Last Eye Exam: no retinopathy (10/12/2009) Reviewed HgBA1c results: 6.3 (03/28/2010)  6.3 (10/11/2009)  Complete Medication List: 1)  Tramadol Hcl 50 Mg Tabs (Tramadol hcl) .Marland Kitchen.. 1 tab by mouth three times a day as needed for knee pain 2)  Amlodipine Besylate 10 Mg Tabs (Amlodipine besylate) .... Take 1 tablet by mouth once a day 3)  Maxzide-25 37.5-25 Mg Tabs (Triamterene-hctz) .Marland Kitchen.. 1 by mouth every am for bp 4)  Hydralazine Hcl 50 Mg Tabs (Hydralazine hcl) .... Take 1 by mouth two times a day 5)  Cod Liver Oil Oil (Cod liver oil) .Marland Kitchen.. 1 tsp every morning as needed  Patient Instructions: 1)  Please try tylenol for the arthritis in your knees. This is safe and may help your pain. Try 2 of the 325mg  tablets up to four times daily 2)  Please schedule a follow-up appointment in 4 months .  Prescriptions: HYDRALAZINE HCL 50 MG TABS (HYDRALAZINE HCL) Take 1 by mouth two times a day  #60 x 11   Entered and Authorized by:   Cindee Salt MD   Signed by:   Cindee Salt MD on 07/31/2010   Method used:   [facility]        [facility] [phone][facility] (retail)       [address]       [place], [place]  [address]       Ph: [phone]       Fax: [phone]   RxID:   [id number] MAXZIDE-25 37.5-25 MG  TABS (TRIAMTERENE-HCTZ) 1 by mouth every am for BP  #30 Tablet x 11   Entered and Authorized by:   Cindee Salt MD   Signed by:   Cindee Salt MD on 07/31/2010   Method used:   [facility]ronically [facility]        [facility] [address] [phone]* (retail)       [address]       [place], [place]  [address]       Ph: [phone]       Fax: [phone]   RxID:   [id number]   Current  Allergies (reviewed today): No known allergies

## 2010-12-13 NOTE — Progress Notes (Signed)
Summary: DNR- REQUEST TO BE REMOVED PER PT  Phone Note Call from Patient   Caller: Patient Call For: Cindee Salt MD Summary of Call: Pts  grandson called, says pt did not understand the DNR form. I spoke w pt says she remembers  signing  DNR , but she did not know what she was signing, says she wants to live, she wants to be resusitated.  Pt requests  that we remove the DNR  form from her files. Pt also wants to add her grandsons  name on her designated party release form. Told pt she needs to  discuss  request w/ her daughter as well. Says she will do so.   Told pt I would discuss w/ her physician.Marland KitchenMarland KitchenDaine Gip  May 29, 2010 10:52 AM  Initial call taken by: Daine Gip,  May 29, 2010 10:53 AM  Follow-up for Phone Call        will stop the DNR order please let her know I took care of this and rescinded the DNR order we can review this again at her next visit Follow-up by: Cindee Salt MD,  May 29, 2010 11:16 AM  Additional Follow-up for Phone Call Additional follow up Details #1::        Called pt informed her DNR form has been recinded per Dr. Alphonsus Sias. Pt was responded ok, great.Daine Gip  June 09, 2010 8:59 AM  Additional Follow-up by: Daine Gip,  June 09, 2010 8:59 AM       Social History: Retired--housekeeper at hospital Widowed  ~2001 7 children -- 5 daughters , 2 sons Never Smoked Alcohol use-no  Asks for daughter, Kathie Rhodes, to make health care decisions if she is unable to. Discussed DNR--she requests this but later decided that she wants attempts at resuscitation Probably would accept feeding tube

## 2010-12-13 NOTE — Miscellaneous (Signed)
Summary: Do Note Resuscitate Order  Do Note Resuscitate Order   Imported By: Beau Fanny 04/04/2010 11:47:09  _____________________________________________________________________  External Attachment:    Type:   Image     Comment:   External Document

## 2010-12-13 NOTE — Progress Notes (Signed)
Summary: tramadol  Phone Note Refill Request Message from:  Fax from Pharmacy on July 31, 2010 11:20 AM  Refills Requested: Medication #1:  TRAMADOL HCL 50 MG TABS 1 tab by mouth three times a day as needed for knee pain   Supply Requested: 3 months   Last Ref[facility]: [facility]/16/[facility] [address] [phone]   Method Requested: Electronic Initial call taken by: Benny Lennert CMA Duncan Dull),  July 31, 2010 11:20 AM  Follow-up for Phone Call        rx sent on sept 10th with 0 refills  pharmacy called and advised.Consuello Masse CMA   Follow-up by: Benny Lennert CMA Duncan Dull),  July 31, 2010 11:39 AM

## 2010-12-13 NOTE — Assessment & Plan Note (Signed)
Summary: 6 MONTH FOLLOW UP/[facility]   Vital Signs:  Patient profile:   77 year old female Weight:      146 pounds BMI:     28.15 Temp:     98.2 degrees F oral Pulse rate:   80 / minute Pulse rhythm:   regular BP sitting:   130 / 60  (left arm) Cuff size:   regular  Vitals Entered By: Mervin Hack CMA Duncan Dull) (Apr 04, 2010 10:56 AM) CC: 6 month follow-up   History of Present Illness: Feels well Hasn't been in since [place] retired  Knows she has diagnosis of DM Tries to be careful with eating doesn't check sugars vision is okay No sores or pain in feet  did note bruise on left arm at antecubital fossa this AM May have hit it on cabinet  On high blood pressure pill no headaches no chest pain No SOB  Lives alone doesn't drive--never did Gets rides to grocery store cooks for herself and her son (widowed) Daughter helps with housework  does have arthritis--mostly in knees walks slowly generally uses tramadol once a day  Preventive Screening-Counseling & Management  Alcohol-Tobacco     Smoking Status: never  Allergies: No Known Drug Allergies  Past History:  Past Medical History: Diabetes mellitus, type II Hyperlipidemia Hypertension Osteoarthritis  Past Surgical History: [facility] - LIVER ABSCESS - STREP WITH STREP SEPSIS ------1/22 - 12/27/04          DVT DURING HOSPITAL 6/07 COLONOSCOPY - DIVERTICS, HEMHDS 6/07 EGD  Family History: Parents died when she was very young 6 brothers---all deceased Not clear about medical history  Social History: Retired--housekeeper at hospital Widowed  ~2001 7 children -- 5 daughters , 2 sons Never Smoked Alcohol use-no  Asks for daughter, Kathie Rhodes, to make health care decisions if she is unable to. Discussed DNR--she requests this Probably would accept feeding tube  Review of Systems       appetite is good sleeps well weight is stable doesn't note any memory problems but after questions, she admits it isn't  perfect No trouble with incontinence  Physical Exam  General:  alert and normal appearance.   Neck:  supple, no masses, no thyromegaly, no carotid bruits, and no cervical lymphadenopathy.   Lungs:  normal respiratory effort and normal breath sounds.   Heart:  normal rate, regular rhythm, and no gallop.   Gr 2/6 systolic murmur in pulmonic area Abdomen:  soft and non-tender.   Msk:  no joint tenderness and no joint swelling.   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   Psych:  normally interactive, good eye contact, not anxious appearing, and not depressed appearing.    Diabetes Management Exam:    Foot Exam (with socks and/or shoes not present):       Sensory-Pinprick/Light touch:          Left medial foot (L-4): normal          Left dorsal foot (L-5): normal          Left lateral foot (S-1): normal          Right medial foot (L-4): normal          Right dorsal foot (L-5): normal          Right lateral  foot (S-1): normal       Inspection:          Left foot: normal          Right foot: normal       Nails:          Left foot: normal          Right foot: normal    Eye Exam:       Eye Exam done elsewhere          Date: 10/12/2009          Results: no retinopathy          Done by: Dr Clint Lipps   Impression & Recommendations:  Problem # 1:  DEMENTIA (ICD-294.8) Assessment New not noted before discussed with daughter occ repeats herself but nothing striking will continue to monitor stop the statin  Problem # 2:  DIABETES MELLITUS, TYPE II (ICD-250.00) Assessment: Unchanged good control with just dietary measures  Labs Reviewed: Creat: 1.0 (03/28/2010)   Microalbumin: 23.2 (06/13/2003)  Last Eye Exam: no retinopathy (10/12/2009) Reviewed HgBA1c  results: 6.3 (03/28/2010)  6.3 (10/11/2009)  Problem # 3:  HYPERTENSION (ICD-401.9) Assessment: Unchanged no changes will consider wean if stays down  Her updated medication list for this problem includes:    Amlodipine Besylate 10 Mg Tabs (Amlodipine besylate) .Marland Kitchen... Take 1 tablet by mouth once a day    Maxzide-25 37.5-25 Mg Tabs (Triamterene-hctz) .Marland Kitchen... 1 by mouth every am for bp    Hydralazine Hcl 50 Mg Tabs (Hydralazine hcl) .Marland Kitchen... Take 1 by mouth two times a day  BP today: 130/60 Prior BP: 154/74 (10/04/2009)  Labs Reviewed: K+: 3.9 (03/28/2010) Creat: : 1.0 (03/28/2010)   Chol: 175 (03/28/2010)   HDL: 84.50 (03/28/2010)   LDL: 73 (03/28/2010)   TG: 86.0 (03/28/2010)  Problem # 4:  HYPERLIPIDEMIA (ICD-272.4) Assessment: Comment Only unclear that Rx is appropriate without CAD and at her age will stop The following medications were removed from the medication list:    Zocor 40 Mg Tabs (Simvastatin) .Marland Kitchen... 1 once daily  Problem # 5:  OSTEOARTHRITIS (ICD-715.90) Assessment: Unchanged does okay with the tramadol  Her updated medication list for this problem includes:    Tramadol Hcl 50 Mg Tabs (Tramadol hcl) .Marland Kitchen... 1 tab by mouth three times a day as needed for knee pain  Complete Medication List: 1)  Tramadol Hcl 50 Mg Tabs (Tramadol hcl) .Marland Kitchen.. 1 tab by mouth three times a day as needed for knee pain 2)  Amlodipine Besylate 10 Mg Tabs (Amlodipine besylate) .... Take 1 tablet by mouth once a day 3)  Maxzide-25 37.5-25 Mg Tabs (Triamterene-hctz) .Marland Kitchen.. 1 by mouth every am for bp 4)  Hydralazine Hcl 50 Mg Tabs (Hydralazine hcl) .... Take 1 by mouth two times a day 5)  Cod Liver Oil Oil (Cod liver oil) .Marland Kitchen.. 1 tsp every morning as needed  Patient Instructions: 1)  Please schedule a follow-up appointment in 4 months .  2)  Please stop the zocor (simvastatin)--the cholesterol medicine  Current Allergies (reviewed today): No known allergies

## 2010-12-13 NOTE — Progress Notes (Signed)
Summary: refill request for tramadol  Phone Note Refill Request Message from:  Fax from Pharmacy  Refills Requested: Medication #1:  TRAMADOL HCL 50 MG TABS 1 tab by mouth three times a day as needed for knee pain   Last Refilled: 03/16/2010 Faxed request from [facility] is on your desk.  Initial call taken by: Lowella Petties CMA,  April 21, 2010 9:37 AM  Follow-up for Phone Call        Rx completed in Dr. Tiajuana Amass Follow-up by: Cindee Salt MD,  April 21, 2010 1:38 PM    Prescriptions: TRAMADOL HCL 50 MG TABS (TRAMADOL HCL) 1 tab by mouth three times a day as needed for knee pain  #90 x 0   Entered and Authorized by:   Cindee Salt MD   Signed by:   Cindee Salt MD on 04/21/2010   Method used:   [facility] to        [facility] [phone]* (retail)       [address]       [place], [place]  [address]       Ph: [phone]       Fax: [phone]   RxID:   [id number]

## 2010-12-14 NOTE — Assessment & Plan Note (Signed)
Summary: ROA FOR 4 MONTH FOLLOW-UP/JRR   Vital Signs:  Patient profile:   77 year old female Weight:      142 pounds Temp:     98.0 degrees F oral Pulse rate:   82 / minute Pulse rhythm:   regular BP sitting:   140 / 70  (left arm) Cuff size:   regular  Vitals Entered By: Mervin Hack CMA Duncan Dull) (November 21, 2010 10:15 AM) CC: 4 month follow-up visit    History of Present Illness: Doing well Living with daughter now--may stay there   Independent with ADLs Doesn't really help with the house work at all Mild memory problems seem stable No incontinence  Still doesn't check sugars  No chest pain No SOB No edema  Occ knee pain doesn't need the tramadol that often  Allergies: No Known Drug Allergies  Past History:  Past medical, surgical, family and social histories (including risk factors) reviewed for relevance to current acute and chronic problems.  Past Medical History: Reviewed history from 04/04/2010 and no changes required. Diabetes mellitus, type II Hyperlipidemia Hypertension Osteoarthritis  Past Surgical History: Reviewed history from 04/04/2010 and no changes required. [facility] - LIVER ABSCESS - STREP WITH STREP SEPSIS ------1/22 - 12/27/04          DVT DURING HOSPITAL 6/07 COLONOSCOPY - DIVERTICS, HEMHDS 6/07 EGD  Family History: Reviewed history from 04/04/2010 and no changes required. Parents died when she was very young 6 brothers---all deceased Not clear about medical history  Social History: Reviewed history from 05/29/2010 and no changes required. Retired--housekeeper at hospital Widowed  ~2001 7 children -- 5 daughters , 2 sons Never Smoked Alcohol use-no  Asks for daughter, Kathie Rhodes, to make health care decisions if she is unable to. Discussed DNR--she requests this but later decided that she wants attempts at resuscitation Probably would accept feeding tube  Review of Systems       appetite is good weight stable sleeps well mood  is good  Physical Exam  General:  alert and normal appearance.   Neck:  supple, no masses, no thyromegaly, and no cervical lymphadenopathy.   Lungs:  normal respiratory effort, no intercostal retractions, no accessory muscle use, and normal breath sounds.   Heart:  normal rate, regular rhythm, and no gallop.  Soft systolic murmur at left upper sternal border Abdomen:  soft and non-tender.   Pulses:  1+ in feet Extremities:  no edema Psych:  normally interactive, good eye contact, not anxious appearing, and not depressed appearing.    Diabetes Management Exam:    Foot Exam (with socks and/or shoes not present):       Sensory-Pinprick/Light touch:          Left medial foot (L-4): normal          Left dorsal foot (L-5): normal          Left lateral foot (S-1): normal          Right medial foot (L-4): normal          Right dorsal foot (L-5): normal          Right lateral foot (S-1): normal       Inspection:          Left foot: normal          Right foot: normal       Nails:          Left foot: normal          Right  foot: normal   Impression & Recommendations:  Problem # 1:  HYPERTENSION (ICD-401.9) Assessment Unchanged  good control no changes needed  Her updated medication list for this problem includes:    Amlodipine Besylate 10 Mg Tabs (Amlodipine besylate) .Marland Kitchen... Take 1 tablet by mouth once a day    Maxzide-25 37.5-25 Mg Tabs (Triamterene-hctz) .Marland Kitchen... 1 by mouth every am for bp    Hydralazine Hcl 50 Mg Tabs (Hydralazine hcl) .Marland Kitchen... Take 1 by mouth two times a day  BP today: 140/70 Prior BP: 138/70 (07/31/2010)  Labs Reviewed: K+: 3.9 (03/28/2010) Creat: : 1.0 (03/28/2010)   Chol: 175 (03/28/2010)   HDL: 84.50 (03/28/2010)   LDL: 73 (03/28/2010)   TG: 86.0 (03/28/2010)  Orders: TLB-Renal Function Panel (80069-RENAL) TLB-CBC Platelet - w/Differential (85025-CBCD) TLB-Hepatic/Liver Function Pnl (80076-HEPATIC) TLB-TSH (Thyroid Stimulating Hormone)  (84443-TSH) Venipuncture (29528)  Problem # 2:  DEMENTIA (ICD-294.8) Assessment: Unchanged  mild without apparent progression asked her to have daughter join her next time   Problem # 3:  DIABETES MELLITUS, TYPE II (ICD-250.00) Assessment: Unchanged  no meds not clear she really has this now will check labs  Labs Reviewed: Creat: 1.0 (03/28/2010)   Microalbumin: 23.2 (06/13/2003)  Last Eye Exam: no retinopathy (10/12/2009) Reviewed HgBA1c results: 6.3 (03/28/2010)  6.3 (10/11/2009)  Orders: TLB-A1C / Hgb A1C (Glycohemoglobin) (83036-A1C)  Problem # 4:  OSTEOARTHRITIS (ICD-715.90) Assessment: Unchanged only occ needs meds  Her updated medication list for this problem includes:    Tramadol Hcl 50 Mg Tabs (Tramadol hcl) .Marland Kitchen... 1 tab by mouth three times a day as needed for knee pain  Complete Medication List: 1)  Tramadol Hcl 50 Mg Tabs (Tramadol hcl) .Marland Kitchen.. 1 tab by mouth three times a day as needed for knee pain 2)  Amlodipine Besylate 10 Mg Tabs (Amlodipine besylate) .... Take 1 tablet by mouth once a day 3)  Maxzide-25 37.5-25 Mg Tabs (Triamterene-hctz) .Marland Kitchen.. 1 by mouth every am for bp 4)  Hydralazine Hcl 50 Mg Tabs (Hydralazine hcl) .... Take 1 by mouth two times a day 5)  Cod Liver Oil Oil (Cod liver oil) .Marland Kitchen.. 1 tsp every morning as needed  Patient Instructions: 1)  Please schedule a follow-up appointment in 4 months .    Orders Added: 1)  TLB-Renal Function Panel [80069-RENAL] 2)  TLB-CBC Platelet - w/Differential [85025-CBCD] 3)  TLB-Hepatic/Liver Function Pnl [80076-HEPATIC] 4)  TLB-TSH (Thyroid Stimulating Hormone) [84443-TSH] 5)  Venipuncture [36415] 6)  TLB-A1C / Hgb A1C (Glycohemoglobin) [83036-A1C] 7)  Est. Patient Level IV [41324]    Current Allergies (reviewed today): No known allergies

## 2011-01-22 ENCOUNTER — Encounter: Payer: Self-pay | Admitting: Internal Medicine

## 2011-01-22 DIAGNOSIS — E119 Type 2 diabetes mellitus without complications: Secondary | ICD-10-CM | POA: Insufficient documentation

## 2011-01-22 DIAGNOSIS — E785 Hyperlipidemia, unspecified: Secondary | ICD-10-CM | POA: Insufficient documentation

## 2011-01-22 DIAGNOSIS — M1990 Unspecified osteoarthritis, unspecified site: Secondary | ICD-10-CM | POA: Insufficient documentation

## 2011-01-22 DIAGNOSIS — I10 Essential (primary) hypertension: Secondary | ICD-10-CM | POA: Insufficient documentation

## 2011-01-22 DIAGNOSIS — I82409 Acute embolism and thrombosis of unspecified deep veins of unspecified lower extremity: Secondary | ICD-10-CM

## 2011-03-19 ENCOUNTER — Other Ambulatory Visit: Payer: Self-pay | Admitting: *Deleted

## 2011-03-19 MED ORDER — TRAMADOL HCL 50 MG PO TABS: ORAL_TABLET | ORAL | Status: DC

## 2011-03-19 NOTE — Telephone Encounter (Signed)
rx done Has appt tomorrow

## 2011-03-19 NOTE — Telephone Encounter (Signed)
Ok to refill? Form on your desk

## 2011-03-20 ENCOUNTER — Encounter: Payer: Self-pay | Admitting: Internal Medicine

## 2011-03-20 ENCOUNTER — Ambulatory Visit (INDEPENDENT_AMBULATORY_CARE_PROVIDER_SITE_OTHER): Payer: Medicare Other | Admitting: Internal Medicine

## 2011-03-20 VITALS — BP 140/60 | HR 88 | Temp 97.6°F | Ht 60.0 in | Wt 146.0 lb

## 2011-03-20 DIAGNOSIS — E119 Type 2 diabetes mellitus without complications: Secondary | ICD-10-CM

## 2011-03-20 DIAGNOSIS — M1710 Unilateral primary osteoarthritis, unspecified knee: Secondary | ICD-10-CM

## 2011-03-20 DIAGNOSIS — F068 Other specified mental disorders due to known physiological condition: Secondary | ICD-10-CM

## 2011-03-20 DIAGNOSIS — I10 Essential (primary) hypertension: Secondary | ICD-10-CM

## 2011-03-20 DIAGNOSIS — F411 Generalized anxiety disorder: Secondary | ICD-10-CM

## 2011-03-20 NOTE — Progress Notes (Signed)
  Subjective:    Patient ID: Jeanette [name], female    DOB: 16-Aug-1917, 77 y.o.   MRN: [id number]  HPI Here with daughter Still living with daughter Independent with ADLs--but doesn't really help with any instrumental ADLs No incontinence  Mild memory problems No apparent progression per daughter  Still with arthritis pain Does okay with tramadol every morning Some soreness if walks a lot  No chest pain  No SOB  Doesn't check sugars Tries to be careful with diet  Current outpatient prescriptions:amLODipine (NORVASC) 10 MG tablet, Take 10 mg by mouth daily.  , Disp: , Rfl: ;  Cod Liver Oil OIL, Take by mouth as directed.  , Disp: , Rfl: ;  hydrALAZINE (APRESOLINE) 50 MG tablet, Take 50 mg by mouth 2 (two) times daily.  , Disp: , Rfl: ;  traMADol (ULTRAM) 50 MG tablet, 1 tab three times daily as needed for knee pain, Disp: 60 tablet, Rfl: 0 triamterene-hydrochlorothiazide (MAXZIDE-25) 37.5-25 MG per tablet, Take 1 tablet by mouth daily. For BP  , Disp: , Rfl:   Past Medical History  Diagnosis Date  . Diabetes mellitus type II   . HLD (hyperlipidemia)   . HTN (hypertension)   . OA (osteoarthritis)   . DVT (deep venous thrombosis) 11/2004    during hospitalization    Past Surgical History  Procedure Date  . Liver abscess 12/03/04    strep with strep sepsis  . Esophagogastroduodenoscopy 04/2006    No family history on file.  History   Social History  . Marital Status: Widowed    Spouse Name: N/A    Number of Children: 7  . Years of Education: N/A   Occupational History  . Retired-housekeeper at the hospital    Social History Main Topics  . Smoking status: Never Smoker   . Smokeless tobacco: Not on file  . Alcohol Use: No  . Drug Use: Not on file  . Sexually Active: Not on file   Other Topics Concern  . Not on file   Social History Narrative  . No narrative on file   Review of Systems Appetite is good Sleeps well Weight around the same No sig anxiety  lately No depression     Objective:   Physical Exam  Constitutional: She appears well-developed and well-nourished. No distress.  Neck: Normal range of motion. Neck supple. No thyromegaly present.  Cardiovascular: Normal rate, regular rhythm, normal heart sounds and intact distal pulses.  Exam reveals no gallop.   No murmur heard. Pulmonary/Chest: Effort normal and breath sounds normal. No respiratory distress. She has no wheezes. She has no rales.  Abdominal: Soft. There is no tenderness.  Musculoskeletal: Normal range of motion. She exhibits no edema and no tenderness.  Lymphadenopathy:    She has no cervical adenopathy.  Psychiatric: She has a normal mood and affect. Her behavior is normal. Judgment and thought content normal.          Assessment & Plan:

## 2011-03-30 NOTE — H&P (Signed)
Jeanette [name], Jeanette [name]               ACCOUNT NO.:  [id number]   MEDICAL RECORD NO.:  [id number]          PATIENT TYPE:  INP   LOCATION:  1825                         FACILITY:  [facility]   PHYSICIAN:  Georgina Quint. Plotnikov, M.D. LHCDATE OF BIRTH:  21-Dec-1916   DATE OF ADMISSION:  12/03/2004  DATE OF DISCHARGE:                                HISTORY & PHYSICAL   CHIEF COMPLAINT:  Vomited, shaking chills tonight.   HISTORY OF PRESENT ILLNESS:  The patient is an 77 year old female who has  been feeling weak for three weeks' duration, with period of shaking chills,  decreased appetite, some nausea, had substantial shaking chills tonight with  nausea and vomiting.  Felt much worse and presented to the ER.  She was  found to be hypotensive with a large liver abscess versus mass.   PAST MEDICAL HISTORY:  Hypertension, dyslipidemia.   MEDICATIONS:  1.  Aspirin 81 mg daily.  2.  HCTZ  25 mg daily.  3.  Lotrel 10/20 daily.  4.  Lipitor 10 mg daily.  5.  Tylenol p.r.n.   ALLERGIES:  No known drug allergies.   FAMILY HISTORY:  Positive for coronary artery disease.   SOCIAL HISTORY:  She is a widow.  She used to do housekeeping for hospital.  She is retired.  Does not smoke or drink alcohol.  Lives alone.   REVIEW OF SYSTEMS:  Negative for chest pain or shortness of breath.  No  syncope.  No neurologic complaints.  Had some palpitations the past three  weeks.  The rest is negative or as above.   PHYSICAL EXAMINATION:  VITAL SIGNS:  Blood pressure 85/53, increased to  112/47, heart rate 106 decreased down to 82, saturations 97% on room air,  respirations 19 to 20.  She is in no acute distress.  HEENT:  Dry mucosa.  NECK:  Supple, no meningeal signs.  LUNGS:  Clear, no wheezes or rales.  HEART:  S1 and S2.  Tachycardic.  ABDOMEN:  Soft, nondistended, nontender.  Liver probably two fingers below  the costal margin.  No masses felt, nontender.  Murphy's sign is negative.  EXTREMITIES:  Lower  extremities without edema.  Calves nontender.  NEUROLOGY:  She is alert, oriented, and cooperative.   LABORATORY DATA:  White count 19.8 with a left shift, hemoglobin 10.7, MCV  92.  Sodium 134, potassium 3.4, glucose 138, BUN 16, creatinine 1.2, AST 65,  ALT 43, total bilirubin 1.7, urinalysis normal.   EKG by EMS was sinus tachycardia and PAC's.   CT scan of the abdomen with 8 x 11 cm right hepatic lobe mass, probable  abscess.   Chest x-ray without acute changes.   ASSESSMENT:  1.  Live mass, abscess more likely versus carcinoma.  Start IV antibiotics,      Unasyn and Flagyl.  Invasive radiology consultation in the morning.      Blood culture, AFP.  2.  Dehydration, treat with IV fluids.  3.  PAC's.  Admit to telemetry.  Check EKG.  4.  Hypertension due to problem #1 and #2.  Will hold Lotrel,  IV fluids.      Need to rule out sepsis, obtain blood culture.  5.  DVT prophylaxis.  Will try to walk the patient.  Will not start Lovenox      while INR is pending and procedure is potentially planned.       AVP/MEDQ  D:  12/04/2004  T:  12/04/2004  Job:  [id number]   cc:   Zachary George, [place].P.

## 2011-03-30 NOTE — Consult Note (Signed)
NAMEKAMAYA, KECKLER               ACCOUNT NO.:  [id number]   MEDICAL RECORD NO.:  [id number]          PATIENT TYPE:  INP   LOCATION:  6708                         FACILITY:  [facility]   PHYSICIAN:  Thornton Park. Daphine Deutscher, MD  DATE OF BIRTH:  12-Mar-1917   DATE OF CONSULTATION:  12/07/2004  DATE OF DISCHARGE:                                   CONSULTATION   REASON FOR CONSULTATION:  Cholelithiasis.   HISTORY OF PRESENT ILLNESS:  [name] is an 77 year old female who was  admitted on December 03, 2004, with a right hepatic abscess and underwent  drain placement the following day.  CT of the abdomen at that time revealed  cholelithiasis with gallbladder thickening with no ductal dilatation.  At  this point, the patient denies any right upper quadrant pain and the pain  that she did have upon admission has stopped.  Prior to admission, she did  have some nausea, vomiting and chills.  Her only complaint now is that she  has a decreased appetite, but no pain with eating.   PAST MEDICAL HISTORY:  1.  Hypertension.  2.  Dyslipidemia.   ALLERGIES:  No known drug allergies.   MEDICATIONS:  Pepcid and Unasyn.   SOCIAL HISTORY:  Lives in [place], [place].  She is widowed.  No  alcohol, tobacco or illicit drug use.   FAMILY HISTORY:  Coronary artery disease.   REVIEW OF SYSTEMS:  As above, otherwise negative.   PHYSICAL EXAMINATION:  VITAL SIGNS:  Temperature 97.4 degrees, pulse 65,  respirations 20, blood pressure 147/80, O2 saturations 93% on room air.  GENERAL APPEARANCE:  She is in no acute distress.  HEENT:  Normal.  NECK:  No carotid or subclavian bruits.  No JVD or thyromegaly.  CHEST:  Clear to auscultation bilaterally.  No wheezing or rhonchi.  HEART:  Regular rate and rhythm.  No S3, murmur or ectopy.  ABDOMEN:  Good bowel sounds.  Nontender and nondistended.  No masses.  No  Murphy's sign.  EXTREMITIES:  No peripheral edema.  No cyanosis or clubbing.  NEUROLOGIC:   Cranial nerves II-XII grossly intact.   LABORATORY DATA:  CT revealed gallstones, as well as hepatic abscess.   White count 21.3 (decreasing), hemoglobin 9.6, hematocrit 28, potassium 3.4.  BUN 8, creatinine 0.5, AST 45 (decreasing), alkaline phosphatase 164,  albumin 1.4.  Blood cultures and abscess cultures are pending.  Sedimentation rate 83.   ASSESSMENT:  1.  Cholelithiasis.  2.  Hepatic abscess, status post drain placement.   PLAN:  At this point, we recommend continuing aggressive IV antibiotic  therapy.  We can perform a cholecystectomy electively once the patient has  recovered from infection/abscess.  The patient was seen and examined by  Molli Hazard B. Daphine Deutscher, M.D.      LB/MEDQ  D:  12/07/2004  T:  12/07/2004  Job:  [id number]   cc:   Lina Sar, M.D. [facility]   Wanda Plump, MD [facility]  [phone] [address] [place], [place] [address]

## 2011-03-30 NOTE — Discharge Summary (Signed)
Jeanette [name], Jeanette [name]               ACCOUNT NO.:  [id number]   MEDICAL RECORD NO.:  [id number]          PATIENT TYPE:  INP   LOCATION:  6708                         FACILITY:  [facility]   PHYSICIAN:  Rene Paci, M.D. LHCDATE OF BIRTH:  02-15-1917   DATE OF ADMISSION:  12/03/2004  DATE OF DISCHARGE:  12/27/2004                                 DISCHARGE SUMMARY   DISCHARGE DIAGNOSES:  1.  Febrile illness.  2.  Nausea and vomiting.  3.  Cholelithiasis.  4.  Streptococcus bacteremia and liver abscess.  5.  Left lower extremity deep venous thrombosis.   HISTORY OF PRESENT ILLNESS:  Jeanette [name] is an 77 year old African-American  female who presented with a three-week history of progressive weakness.  She  also describes shaking chills, decreased appetite, and nausea.   PAST MEDICAL HISTORY:  1.  Hypertension.  2.  Dyslipidemia.   HOSPITAL COURSE:  Problem 1.  ID.  The patient presented with fever and  leukocytosis.  CT of the abdomen and pelvis was concerning for a liver  abscess.  She was seen in consultation by interventional radiology.  The  patient had a percutaneous drain placed.  The patient was empirically  started on Unasyn and Flagyl.  Blood cultures were positive x2 for Strep.  Her liver culture was also positive for Strep.  The patient was seen in  consultation by infectious disease.  They recommended a total of 14 days of  Unasyn followed by 30 days of Augmentin.  The patient has completed her  Unasyn during this admission and she is on her 10th day of Augmentin.  Follow-up blood cultures showed resolution of her bacteremia.  Serial CT's  showed reduction in the size of her abscess.  However, on December 22, 2004,  we did place a second percutaneous drain to expedite the drainage of her  abscess.  Serial CT showed this is improved and her drains have now been  discontinued.   Problem 2.  GI.  The patient had elevated LFT's during this admission.  In  addition to her  liver abscess, she was found to have cholelithiasis.  She  was seen in consultation by surgery who felt that an elective  cholecystectomy could be pursued as an outpatient.   Problem 3.  Left lower extremity DVT.  The patient's hospitalization was  further complicated by the development of a left lower extremity DVT.  The  patient was started on Lovenox and Coumadin.  She is currently  subtherapeutic and we will have her continue the Lovenox as an outpatient  until she is therapeutic on her Coumadin.   DISCHARGE LABORATORY DATA:  Pro-time on December 26, 2004, was 15.6, INR  1.4.  For whatever reason, there is not a pro-time available today.  BMET  was normal.  Hemoglobin was 10.   DISCHARGE MEDICATIONS:  1.  Lotrel 10/20 daily.  2.  Lovenox 65 mg subcu b.i.d.  3.  Coumadin 5 mg daily as directed.  4.  Augmentin 875 mg b.i.d., last dose January 16, 2005.  5.  Lipitor 10 mg daily.  6.  Aspirin  81 mg daily.   She has been instructed to hold her HCTZ until she follows up with Dr.  Hetty Ely.  She is to go to [facility] office on Friday, February 17, for a  pro-time and then see Dr. Hetty Ely Wednesday, March 1, at 11 a.m.      LC/MEDQ  D:  12/27/2004  T:  12/27/2004  Job:  [id number]   cc:   Laurita Quint, M.D.  [address] [place]  [place] [address]  Fax: [phone]   Thornton Park. Daphine Deutscher, MD  [address], [address]  [place]  [place] [address]

## 2011-05-19 ENCOUNTER — Other Ambulatory Visit: Payer: Self-pay | Admitting: Internal Medicine

## 2011-07-19 ENCOUNTER — Other Ambulatory Visit: Payer: Self-pay | Admitting: *Deleted

## 2011-07-19 MED ORDER — TRAMADOL HCL 50 MG PO TABS: 50.0000 mg | ORAL_TABLET | Freq: Three times a day (TID) | ORAL | Status: DC | PRN

## 2011-07-19 NOTE — Telephone Encounter (Signed)
rx sent to pharmacy by e-script  

## 2011-07-24 ENCOUNTER — Other Ambulatory Visit (INDEPENDENT_AMBULATORY_CARE_PROVIDER_SITE_OTHER): Payer: Medicare Other

## 2011-07-24 ENCOUNTER — Ambulatory Visit (INDEPENDENT_AMBULATORY_CARE_PROVIDER_SITE_OTHER): Payer: Medicare Other | Admitting: Internal Medicine

## 2011-07-24 ENCOUNTER — Encounter: Payer: Self-pay | Admitting: Internal Medicine

## 2011-07-24 VITALS — BP 159/75 | HR 87 | Temp 98.7°F | Ht 60.0 in | Wt 145.0 lb

## 2011-07-24 DIAGNOSIS — E119 Type 2 diabetes mellitus without complications: Secondary | ICD-10-CM

## 2011-07-24 DIAGNOSIS — F068 Other specified mental disorders due to known physiological condition: Secondary | ICD-10-CM

## 2011-07-24 DIAGNOSIS — M1710 Unilateral primary osteoarthritis, unspecified knee: Secondary | ICD-10-CM

## 2011-07-24 DIAGNOSIS — I10 Essential (primary) hypertension: Secondary | ICD-10-CM

## 2011-07-24 NOTE — Assessment & Plan Note (Signed)
Has had good control Doesn't check Eats well Lab Results  Component Value Date   HGBA1C 6.4 11/21/2010   Will recheck

## 2011-07-24 NOTE — Assessment & Plan Note (Signed)
Mild without progression Probably vascular BP control generally okay Will start low dose aspirin

## 2011-07-24 NOTE — Patient Instructions (Signed)
Please start low dose aspirin 81mg  daily, or every other day. Stop if you have any bleeding problems

## 2011-07-24 NOTE — Assessment & Plan Note (Signed)
Uses the tramadol daily May need to increase to bid

## 2011-07-24 NOTE — Assessment & Plan Note (Signed)
BP Readings from Last 3 Encounters:  07/24/11 159/75  03/20/11 140/60  11/21/10 140/70   Generally okay No changes for now--esp at her age

## 2011-07-24 NOTE — Progress Notes (Signed)
  Subjective:    Patient ID: Jeanette [name], female    DOB: [birth year], 77 y.o.   MRN: [id number]  HPI Doing fine Here with daughter  Knees are still "slow" Uses the tramadol every morning. May need to increase to bid Uses walker at home--uses cane when she can hold onto daughter Sits most of the time  Still independent with ADLs Only makes her own bed---daughter handles all other instrumental ADLs Same memory problems---no obvious worsening per daughter  Jeanette [name] fairly well Daughter feeds healthy diet---main meal for lunch  Doesn't check sugars No apparent problems No sores or pain in feet  NO chest pain No SOB  Current Outpatient Prescriptions on File Prior to Visit  Medication Sig Dispense Refill  . amLODipine (NORVASC) 10 MG tablet Take 10 mg by mouth daily.        . Cod Liver Oil OIL Take by mouth as directed.        . hydrALAZINE (APRESOLINE) 50 MG tablet Take 50 mg by mouth 2 (two) times daily.        . traMADol (ULTRAM) 50 MG tablet Take 1 tablet (50 mg total) by mouth 3 (three) times daily as needed for pain.  60 tablet  0  . triamterene-hydrochlorothiazide (MAXZIDE-25) 37.5-25 MG per tablet Take 1 tablet by mouth daily. For BP          No Known Allergies  Past Medical History  Diagnosis Date  . Diabetes mellitus type II   . HLD (hyperlipidemia)   . HTN (hypertension)   . OA (osteoarthritis)   . DVT (deep venous thrombosis) 11/2004    during hospitalization    Past Surgical History  Procedure Date  . Liver abscess 12/03/04    strep with strep sepsis  . Esophagogastroduodenoscopy 04/2006    No family history on file.  History   Social History  . Marital Status: Widowed    Spouse Name: N/A    Number of Children: 7  . Years of Education: N/A   Occupational History  . Retired-housekeeper at the hospital    Social History Main Topics  . Smoking status: Never Smoker   . Smokeless tobacco: Never Used  . Alcohol Use: No  . Drug Use: Not on file  .  Sexually Active: Not on file   Other Topics Concern  . Not on file   Social History Narrative  . No narrative on file   Review of Systems Appetite is fine Generally sleeps well--occ bad night    Objective:   Physical Exam  Constitutional: She appears well-developed and well-nourished. No distress.  Neck: Normal range of motion. Neck supple. No thyromegaly present.  Cardiovascular: Normal rate, regular rhythm, normal heart sounds and intact distal pulses.  Exam reveals no gallop.   No murmur heard. Pulmonary/Chest: Effort normal and breath sounds normal. No respiratory distress. She has no wheezes. She has no rales.  Musculoskeletal: She exhibits no edema and no tenderness.  Lymphadenopathy:    She has no cervical adenopathy.  Psychiatric: She has a normal mood and affect. Her behavior is normal.          Assessment & Plan:

## 2011-07-24 NOTE — Progress Notes (Signed)
Addended by: Liane Comber C on: 07/24/2011 12:11 PM   Modules accepted: Orders

## 2011-07-25 LAB — BASIC METABOLIC PANEL
CO2: 28 mEq/L (ref 19–32)
Glucose, Bld: 116 mg/dL — ABNORMAL HIGH (ref 70–99)
Potassium: 4.3 mEq/L (ref 3.5–5.1)
Sodium: 141 mEq/L (ref 135–145)

## 2011-08-01 ENCOUNTER — Encounter: Payer: Self-pay | Admitting: *Deleted

## 2011-08-12 ENCOUNTER — Other Ambulatory Visit: Payer: Self-pay | Admitting: Internal Medicine

## 2011-08-22 ENCOUNTER — Other Ambulatory Visit: Payer: Self-pay | Admitting: *Deleted

## 2011-08-22 MED ORDER — TRAMADOL HCL 50 MG PO TABS: 50.0000 mg | ORAL_TABLET | Freq: Three times a day (TID) | ORAL | Status: DC | PRN

## 2011-08-22 NOTE — Telephone Encounter (Signed)
Okay #60 x 1 

## 2011-08-22 NOTE — Telephone Encounter (Signed)
Ok to refill 

## 2011-08-22 NOTE — Telephone Encounter (Signed)
rx sent to pharmacy by e-script  

## 2011-10-29 ENCOUNTER — Other Ambulatory Visit: Payer: Self-pay | Admitting: Internal Medicine

## 2011-12-27 ENCOUNTER — Other Ambulatory Visit: Payer: Self-pay | Admitting: Internal Medicine

## 2012-01-02 ENCOUNTER — Other Ambulatory Visit: Payer: Self-pay | Admitting: Internal Medicine

## 2012-01-14 ENCOUNTER — Encounter: Payer: Self-pay | Admitting: Internal Medicine

## 2012-01-14 ENCOUNTER — Ambulatory Visit (INDEPENDENT_AMBULATORY_CARE_PROVIDER_SITE_OTHER): Payer: Medicare Other | Admitting: Internal Medicine

## 2012-01-14 VITALS — BP 138/70 | HR 84 | Temp 97.8°F | Wt 145.0 lb

## 2012-01-14 DIAGNOSIS — M1710 Unilateral primary osteoarthritis, unspecified knee: Secondary | ICD-10-CM

## 2012-01-14 DIAGNOSIS — E119 Type 2 diabetes mellitus without complications: Secondary | ICD-10-CM

## 2012-01-14 DIAGNOSIS — I10 Essential (primary) hypertension: Secondary | ICD-10-CM | POA: Diagnosis not present

## 2012-01-14 DIAGNOSIS — F0150 Vascular dementia without behavioral disturbance: Secondary | ICD-10-CM

## 2012-01-14 LAB — LDL CHOLESTEROL, DIRECT: Direct LDL: 174.7 mg/dL

## 2012-01-14 LAB — HEPATIC FUNCTION PANEL
ALT: 15 U/L (ref 0–35)
AST: 20 U/L (ref 0–37)
Bilirubin, Direct: 0 mg/dL (ref 0.0–0.3)
Total Bilirubin: 0.5 mg/dL (ref 0.3–1.2)

## 2012-01-14 LAB — CBC WITH DIFFERENTIAL/PLATELET
Basophils Absolute: 0.1 10*3/uL (ref 0.0–0.1)
Hemoglobin: 14.4 g/dL (ref 12.0–15.0)
Lymphocytes Relative: 21.7 % (ref 12.0–46.0)
Monocytes Relative: 9.5 % (ref 3.0–12.0)
Neutro Abs: 4.6 10*3/uL (ref 1.4–7.7)
Neutrophils Relative %: 66.1 % (ref 43.0–77.0)
RBC: 4.44 Mil/uL (ref 3.87–5.11)
RDW: 15.8 % — ABNORMAL HIGH (ref 11.5–14.6)

## 2012-01-14 LAB — LIPID PANEL
HDL: 75.8 mg/dL (ref >39.00)
Total CHOL/HDL Ratio: 4
Triglycerides: 88 mg/dL (ref 0.0–149.0)

## 2012-01-14 LAB — BASIC METABOLIC PANEL
Calcium: 9.6 mg/dL (ref 8.4–10.5)
Creatinine, Ser: 1.2 mg/dL (ref 0.4–1.2)
GFR: 56.08 mL/min — ABNORMAL LOW (ref >60.00)
Glucose, Bld: 168 mg/dL — ABNORMAL HIGH (ref 70–99)
Sodium: 141 mEq/L (ref 135–145)

## 2012-01-14 NOTE — Progress Notes (Signed)
  Subjective:    Patient ID: Jeanette [name], female    DOB: 05/22/1917, 78 y.o.   MRN: [id number]  HPI Doing well Here with daughter  Still independent with ADLs Now having trouble making bed and daughter does all instrumental ADLs NO major changes in memory since last visit  Still doesn't check sugars No apparent problems Daughter cooks meals---eats the same meal every day---but good amt of veggies, etc  No chest pain No SOB  Uses walker at home [place] with with someone she can hold onto  Arthritis pain is variable Uses the tramadol bid----even if she seems to not be having pain  Current Outpatient Prescriptions on File Prior to Visit  Medication Sig Dispense Refill  . amLODipine (NORVASC) 10 MG tablet TAKE 1 TABLET BY MOUTH ONCE A DAY  30 tablet  10  . aspirin 81 MG tablet Take 81 mg by mouth daily.        . Cod Liver Oil OIL Take by mouth as directed.        . hydrALAZINE (APRESOLINE) 50 MG tablet TAKE 1 TABLET TWICE A DAY  60 tablet  11  . traMADol (ULTRAM) 50 MG tablet TAKE 1 TABLET BY MOUTH 3 TIMES A DAY AS NEEDED FOR PAIN  60 tablet  1  . triamterene-hydrochlorothiazide (MAXZIDE-25) 37.5-25 MG per tablet TAKE 1 TABLET EVERY MORNING FOR BLOOD PRESSURE  30 tablet  10    No Known Allergies  Past Medical History  Diagnosis Date  . Diabetes mellitus type II   . HLD (hyperlipidemia)   . HTN (hypertension)   . OA (osteoarthritis)   . DVT (deep venous thrombosis) 11/2004    during hospitalization    Past Surgical History  Procedure Date  . Liver abscess 12/03/04    strep with strep sepsis  . Esophagogastroduodenoscopy 04/2006    No family history on file.  History   Social History  . Marital Status: Widowed    Spouse Name: N/A    Number of Children: 7  . Years of Education: N/A   Occupational History  . Retired-housekeeper at the hospital    Social History Main Topics  . Smoking status: Never Smoker   . Smokeless tobacco: Never Used  . Alcohol Use: No    . Drug Use: Not on file  . Sexually Active: Not on file   Other Topics Concern  . Not on file   Social History Narrative  . No narrative on file   Review of Systems Sleeps well Appetite is good Weight is stable Remains satisfied---no depression     Objective:   Physical Exam  Constitutional: She appears well-developed and well-nourished. No distress.  Neck: Normal range of motion. Neck supple.  Cardiovascular: Normal rate, regular rhythm, normal heart sounds and intact distal pulses.  Exam reveals no gallop.   No murmur heard. Pulmonary/Chest: Effort normal and breath sounds normal. No respiratory distress. She has no wheezes. She has no rales.  Abdominal: Soft. There is no tenderness.  Musculoskeletal: She exhibits no edema and no tenderness.  Lymphadenopathy:    She has no cervical adenopathy.  Skin: No rash noted.       No foot lesions  Psychiatric: She has a normal mood and affect. Her behavior is normal. Judgment and thought content normal.          Assessment & Plan:

## 2012-01-14 NOTE — Assessment & Plan Note (Signed)
BP Readings from Last 3 Encounters:  01/14/12 138/70  07/24/11 159/75  03/20/11 140/60   Good control No changes needed

## 2012-01-14 NOTE — Assessment & Plan Note (Signed)
Does well with the tramadol bid Can hold if doing well---but she is very used to using it bid

## 2012-01-14 NOTE — Assessment & Plan Note (Signed)
Seems to be stable No Rx  Have recommended aspirin

## 2012-01-14 NOTE — Assessment & Plan Note (Signed)
Still seems to be okay No meds and doesn't check--so will check a1c again Check lipids--was okay 2 years ago

## 2012-01-14 NOTE — Patient Instructions (Signed)
Please start low dose aspirin---81mg  daily. If increased bruising, reduce to 81mg  every other day

## 2012-01-18 ENCOUNTER — Encounter: Payer: Self-pay | Admitting: *Deleted

## 2012-01-21 ENCOUNTER — Ambulatory Visit: Payer: Medicare Other | Admitting: Internal Medicine

## 2012-01-22 ENCOUNTER — Ambulatory Visit: Payer: Medicare Other | Admitting: Internal Medicine

## 2012-01-31 ENCOUNTER — Telehealth: Payer: Self-pay

## 2012-01-31 NOTE — Telephone Encounter (Signed)
pts daughter Lorrin Mais request rx for w/c. Pt needs assistance with walking and request w/c to help maneuver in her home and when pt goes out of home. Ms Lorenda Hatchet can be reached at [phone].Please advise.

## 2012-01-31 NOTE — Telephone Encounter (Signed)
Left message on machine that rx for walker is ready for pick-up, and it will be at our front desk.

## 2012-01-31 NOTE — Telephone Encounter (Signed)
Rx written.

## 2012-02-01 NOTE — Telephone Encounter (Signed)
Daughter was asking for a rx for a wheelchair, not walker. Please advise

## 2012-02-01 NOTE — Telephone Encounter (Signed)
Sorry  Rx written for lightweight wheelchair May not get payment for this without office note documenting need but she should at least not have to pay tax with the RX

## 2012-03-10 ENCOUNTER — Other Ambulatory Visit: Payer: Self-pay | Admitting: Internal Medicine

## 2012-05-19 ENCOUNTER — Other Ambulatory Visit: Payer: Self-pay | Admitting: Internal Medicine

## 2012-07-15 ENCOUNTER — Ambulatory Visit (INDEPENDENT_AMBULATORY_CARE_PROVIDER_SITE_OTHER): Payer: Medicare Other | Admitting: Internal Medicine

## 2012-07-15 ENCOUNTER — Encounter: Payer: Self-pay | Admitting: Internal Medicine

## 2012-07-15 VITALS — BP 132/60 | HR 85 | Temp 98.4°F | Ht 60.0 in | Wt 146.0 lb

## 2012-07-15 DIAGNOSIS — IMO0002 Reserved for concepts with insufficient information to code with codable children: Secondary | ICD-10-CM

## 2012-07-15 DIAGNOSIS — M1710 Unilateral primary osteoarthritis, unspecified knee: Secondary | ICD-10-CM

## 2012-07-15 DIAGNOSIS — E785 Hyperlipidemia, unspecified: Secondary | ICD-10-CM

## 2012-07-15 DIAGNOSIS — E119 Type 2 diabetes mellitus without complications: Secondary | ICD-10-CM

## 2012-07-15 DIAGNOSIS — I10 Essential (primary) hypertension: Secondary | ICD-10-CM | POA: Diagnosis not present

## 2012-07-15 MED ORDER — TRIAMTERENE-HCTZ 37.5-25 MG PO TABS: 1.0000 | ORAL_TABLET | Freq: Every day | ORAL | Status: DC

## 2012-07-15 MED ORDER — TRAMADOL HCL 50 MG PO TABS: 50.0000 mg | ORAL_TABLET | Freq: Three times a day (TID) | ORAL | Status: DC | PRN

## 2012-07-15 MED ORDER — HYDRALAZINE HCL 50 MG PO TABS: 50.0000 mg | ORAL_TABLET | Freq: Two times a day (BID) | ORAL | Status: DC

## 2012-07-15 NOTE — Assessment & Plan Note (Signed)
BP Readings from Last 3 Encounters:  07/15/12 132/60  01/14/12 138/70  07/24/11 159/75   Good control No changes needed Lab Results  Component Value Date   CREATININE 1.2 01/14/2012

## 2012-07-15 NOTE — Progress Notes (Signed)
  Subjective:    Patient ID: Jeanette [name], female    DOB: 08/28/1917, 78 y.o.   MRN: [id number]  HPI Here with daughter  Doing well Uses cane and walker at home to walk Wheelchair when she goes out  Still independent with ADLs Family lives nearby and 1 daughter lives with her---they take care of all instrumental ADLs  No major arthritis pain Taking the tramadol bid---will try without the AM dose Rarely uses the tylenol  Still not checking sugars No meds for this Due for eye exam  No chest pain No SOB No edema  Discussed statin for cholesterol She and daughter defer to me but are not excited about another med  Current Outpatient Prescriptions on File Prior to Visit  Medication Sig Dispense Refill  . amLODipine (NORVASC) 10 MG tablet TAKE 1 TABLET BY MOUTH ONCE A DAY  30 tablet  10  . aspirin 81 MG tablet Take 81 mg by mouth daily.        . Cod Liver Oil OIL Take by mouth as directed.        Marland Kitchen DISCONTD: hydrALAZINE (APRESOLINE) 50 MG tablet TAKE 1 TABLET TWICE A DAY  60 tablet  11  . DISCONTD: triamterene-hydrochlorothiazide (MAXZIDE-25) 37.5-25 MG per tablet TAKE 1 TABLET EVERY MORNING FOR BLOOD PRESSURE  30 tablet  10    No Known Allergies  Past Medical History  Diagnosis Date  . Diabetes mellitus type II   . HLD (hyperlipidemia)   . HTN (hypertension)   . OA (osteoarthritis)   . DVT (deep venous thrombosis) 11/2004    during hospitalization    Past Surgical History  Procedure Date  . Liver abscess 12/03/04    strep with strep sepsis  . Esophagogastroduodenoscopy 04/2006    No family history on file.  History   Social History  . Marital Status: Widowed    Spouse Name: N/A    Number of Children: 7  . Years of Education: N/A   Occupational History  . Retired-housekeeper at the hospital    Social History Main Topics  . Smoking status: Never Smoker   . Smokeless tobacco: Never Used  . Alcohol Use: No  . Drug Use: Not on file  . Sexually Active: Not  on file   Other Topics Concern  . Not on file   Social History Narrative  . No narrative on file   Review of Systems Appetite is good Sleeps well Weight is stable    Objective:   Physical Exam  Constitutional: She appears well-developed and well-nourished. No distress.  Neck: Normal range of motion. Neck supple. No thyromegaly present.  Cardiovascular: Normal rate, regular rhythm, normal heart sounds and intact distal pulses.  Exam reveals no gallop.   No murmur heard. Pulmonary/Chest: Effort normal and breath sounds normal. No respiratory distress. She has no wheezes. She has no rales.  Abdominal: Soft. There is no tenderness.  Musculoskeletal: She exhibits no edema and no tenderness.  Lymphadenopathy:    She has no cervical adenopathy.  Psychiatric: She has a normal mood and affect. Her behavior is normal.          Assessment & Plan:

## 2012-07-15 NOTE — Assessment & Plan Note (Signed)
Does okay with tramadol Will try without the AM dose

## 2012-07-15 NOTE — Assessment & Plan Note (Signed)
Discussed Rx At her age, with diet controlled DM---not clear that benefits of medication>risks Will hold off on statin

## 2012-07-15 NOTE — Assessment & Plan Note (Signed)
Lab Results  Component Value Date   HGBA1C 6.5 01/14/2012   Has had good control Will recheck

## 2012-07-17 ENCOUNTER — Encounter: Payer: Self-pay | Admitting: *Deleted

## 2012-08-06 ENCOUNTER — Other Ambulatory Visit: Payer: Self-pay | Admitting: Internal Medicine

## 2012-11-11 ENCOUNTER — Other Ambulatory Visit: Payer: Self-pay | Admitting: Internal Medicine

## 2012-11-20 ENCOUNTER — Other Ambulatory Visit: Payer: Self-pay | Admitting: *Deleted

## 2012-11-20 MED ORDER — AMLODIPINE BESYLATE 10 MG PO TABS: ORAL_TABLET | ORAL | Status: DC

## 2013-01-12 ENCOUNTER — Ambulatory Visit (INDEPENDENT_AMBULATORY_CARE_PROVIDER_SITE_OTHER): Payer: Medicare Other | Admitting: Internal Medicine

## 2013-01-12 ENCOUNTER — Encounter: Payer: Self-pay | Admitting: Internal Medicine

## 2013-01-12 VITALS — BP 132/66 | HR 90 | Temp 98.3°F | Wt 148.0 lb

## 2013-01-12 DIAGNOSIS — I10 Essential (primary) hypertension: Secondary | ICD-10-CM | POA: Diagnosis not present

## 2013-01-12 DIAGNOSIS — E119 Type 2 diabetes mellitus without complications: Secondary | ICD-10-CM

## 2013-01-12 DIAGNOSIS — F0150 Vascular dementia without behavioral disturbance: Secondary | ICD-10-CM | POA: Diagnosis not present

## 2013-01-12 DIAGNOSIS — Z1331 Encounter for screening for depression: Secondary | ICD-10-CM

## 2013-01-12 DIAGNOSIS — E785 Hyperlipidemia, unspecified: Secondary | ICD-10-CM

## 2013-01-12 LAB — CBC WITH DIFFERENTIAL/PLATELET
Basophils Absolute: 0.1 10*3/uL (ref 0.0–0.1)
Eosinophils Absolute: 0.1 10*3/uL (ref 0.0–0.7)
Eosinophils Relative: 0.8 % (ref 0.0–5.0)
MCV: 95.3 fl (ref 78.0–100.0)
Monocytes Absolute: 0.8 10*3/uL (ref 0.1–1.0)
Neutrophils Relative %: 62.3 % (ref 43.0–77.0)
Platelets: 171 10*3/uL (ref 150.0–400.0)
RDW: 17.1 % — ABNORMAL HIGH (ref 11.5–14.6)
WBC: 7.8 10*3/uL (ref 4.5–10.5)

## 2013-01-12 LAB — HEPATIC FUNCTION PANEL
AST: 24 U/L (ref 0–37)
Albumin: 3.9 g/dL (ref 3.5–5.2)
Total Bilirubin: 0.8 mg/dL (ref 0.3–1.2)

## 2013-01-12 LAB — HEMOGLOBIN A1C: Hgb A1c MFr Bld: 6.5 % (ref 4.6–6.5)

## 2013-01-12 LAB — BASIC METABOLIC PANEL
BUN: 21 mg/dL (ref 6–23)
Chloride: 103 mEq/L (ref 96–112)
Creatinine, Ser: 1.1 mg/dL (ref 0.4–1.2)
Glucose, Bld: 130 mg/dL — ABNORMAL HIGH (ref 70–99)

## 2013-01-12 LAB — LDL CHOLESTEROL, DIRECT: Direct LDL: 159.9 mg/dL

## 2013-01-12 LAB — LIPID PANEL
HDL: 69.7 mg/dL (ref >39.00)
VLDL: 20.4 mg/dL (ref 0.0–40.0)

## 2013-01-12 LAB — MICROALBUMIN / CREATININE URINE RATIO
Creatinine,U: 164.5 mg/dL
Microalb, Ur: 3.4 mg/dL — ABNORMAL HIGH (ref 0.0–1.9)

## 2013-01-12 LAB — TSH: TSH: 3.34 u[IU]/mL (ref 0.35–5.50)

## 2013-01-12 NOTE — Progress Notes (Signed)
  Subjective:    Patient ID: Jeanette [name], female    DOB: 04-05-1917, 79 y.o.   MRN: [id number]  HPI Here with daughter No falls No depression or anhedonia  No sig change in condition No decline in memory Still independent with ADLs Daughter does all instrumental ADLs still  No chest pain No sig edema No SOB No dizziness or syncope  No pain in knees  Notes that "I can't walk fast"  Doesn't check sugars Hasn't had eye exam  Current Outpatient Prescriptions on File Prior to Visit  Medication Sig Dispense Refill  . amLODipine (NORVASC) 10 MG tablet TAKE 1 TABLET BY MOUTH ONCE A DAY  30 tablet  6  . aspirin 81 MG tablet Take 81 mg by mouth daily.        . Cod Liver Oil OIL Take by mouth as directed.        . hydrALAZINE (APRESOLINE) 50 MG tablet Take 1 tablet (50 mg total) by mouth 2 (two) times daily.  60 tablet  11  . traMADol (ULTRAM) 50 MG tablet TAKE 1 TABLET BY MOUTH 3 TIMES A DAY AS NEEDED FOR PAIN  90 tablet  1  . triamterene-hydrochlorothiazide (MAXZIDE-25) 37.5-25 MG per tablet Take 1 each (1 tablet total) by mouth daily.  30 tablet  11   No current facility-administered medications on file prior to visit.    No Known Allergies  Past Medical History  Diagnosis Date  . Diabetes mellitus type II   . HLD (hyperlipidemia)   . HTN (hypertension)   . OA (osteoarthritis)   . DVT (deep venous thrombosis) 11/2004    during hospitalization    Past Surgical History  Procedure Laterality Date  . Liver abscess  12/03/04    strep with strep sepsis  . Esophagogastroduodenoscopy  04/2006    No family history on file.  History   Social History  . Marital Status: Widowed    Spouse Name: N/A    Number of Children: 7  . Years of Education: N/A   Occupational History  . Retired-housekeeper at the hospital    Social History Main Topics  . Smoking status: Never Smoker   . Smokeless tobacco: Never Used  . Alcohol Use: No  . Drug Use: Not on file  . Sexually  Active: Not on file   Other Topics Concern  . Not on file   Social History Narrative  . No narrative on file   Review of Systems Appetite is very good Weight up 2# Sleeps well Doing some crocheting---having some trouble doing it right though Bowels have been okay still    Objective:   Physical Exam  Constitutional: She appears well-developed and well-nourished. No distress.  Neck: Normal range of motion. Neck supple. No thyromegaly present.  Cardiovascular: Normal rate, regular rhythm, normal heart sounds and intact distal pulses.  Exam reveals no gallop.   No murmur heard. Pulmonary/Chest: Effort normal and breath sounds normal. No respiratory distress. She has no wheezes. She has no rales.  Abdominal: Soft. There is no tenderness.  Musculoskeletal: She exhibits no edema and no tenderness.  Lymphadenopathy:    She has no cervical adenopathy.  Skin: No rash noted. No erythema.  No foot lesions  Psychiatric: She has a normal mood and affect. Her behavior is normal.          Assessment & Plan:

## 2013-01-12 NOTE — Assessment & Plan Note (Signed)
Discussed in view of revised guidelines Will hold off on Rx due to age

## 2013-01-12 NOTE — Assessment & Plan Note (Signed)
Good control Due for labs  BP Readings from Last 3 Encounters:  01/12/13 132/66  07/15/12 132/60  01/14/12 138/70

## 2013-01-12 NOTE — Assessment & Plan Note (Signed)
Mild and no signs of progression On ASA

## 2013-01-12 NOTE — Assessment & Plan Note (Signed)
Lab Results  Component Value Date   HGBA1C 6.4 07/15/2012   Well controlled without meds Will recheck

## 2013-01-15 ENCOUNTER — Encounter: Payer: Self-pay | Admitting: *Deleted

## 2013-02-10 ENCOUNTER — Other Ambulatory Visit: Payer: Self-pay | Admitting: Internal Medicine

## 2013-03-10 DIAGNOSIS — H2510 Age-related nuclear cataract, unspecified eye: Secondary | ICD-10-CM | POA: Diagnosis not present

## 2013-05-08 ENCOUNTER — Other Ambulatory Visit: Payer: Self-pay | Admitting: Internal Medicine

## 2013-06-24 ENCOUNTER — Other Ambulatory Visit: Payer: Self-pay | Admitting: Internal Medicine

## 2013-07-09 ENCOUNTER — Other Ambulatory Visit: Payer: Self-pay | Admitting: Internal Medicine

## 2013-07-20 ENCOUNTER — Encounter: Payer: Self-pay | Admitting: Internal Medicine

## 2013-07-20 ENCOUNTER — Ambulatory Visit (INDEPENDENT_AMBULATORY_CARE_PROVIDER_SITE_OTHER): Payer: Medicare Other | Admitting: Internal Medicine

## 2013-07-20 VITALS — BP 140/60 | HR 78 | Temp 97.8°F

## 2013-07-20 DIAGNOSIS — E119 Type 2 diabetes mellitus without complications: Secondary | ICD-10-CM | POA: Diagnosis not present

## 2013-07-20 DIAGNOSIS — F0150 Vascular dementia without behavioral disturbance: Secondary | ICD-10-CM | POA: Diagnosis not present

## 2013-07-20 DIAGNOSIS — IMO0002 Reserved for concepts with insufficient information to code with codable children: Secondary | ICD-10-CM

## 2013-07-20 DIAGNOSIS — M1710 Unilateral primary osteoarthritis, unspecified knee: Secondary | ICD-10-CM | POA: Diagnosis not present

## 2013-07-20 DIAGNOSIS — I10 Essential (primary) hypertension: Secondary | ICD-10-CM

## 2013-07-20 NOTE — Progress Notes (Signed)
  Subjective:    Patient ID: Jeanette [name], female    DOB: 1917-08-19, 79 y.o.   MRN: [id number]  HPI Here with daughter Lives with daughter Kathie Rhodes  Has trouble walking-- can't go fast States the pain isn't bad Takes tramadol once in AM Walks okay in home with walker  Doesn't check sugars No apparent problems with this No pain or sores in feet  No chest pain Breathing is fine No sig edema  Mild memory problems No apparent progression  Current Outpatient Prescriptions on File Prior to Visit  Medication Sig Dispense Refill  . amLODipine (NORVASC) 10 MG tablet TAKE 1 TABLET EVERY DAY  30 tablet  6  . aspirin 81 MG tablet Take 81 mg by mouth daily.        . Cod Liver Oil OIL Take by mouth as directed.        . hydrALAZINE (APRESOLINE) 50 MG tablet Take 1 tablet (50 mg total) by mouth 2 (two) times daily.  60 tablet  11  . traMADol (ULTRAM) 50 MG tablet TAKE 1 TABLET BY MOUTH 3 TIMES A DAY AS NEEDED FOR PAIN  90 tablet  1  . triamterene-hydrochlorothiazide (MAXZIDE-25) 37.5-25 MG per tablet TAKE 1 TABLET BY MOUTH DAILY  30 tablet  11   No current facility-administered medications on file prior to visit.    No Known Allergies  Past Medical History  Diagnosis Date  . Diabetes mellitus type II   . HLD (hyperlipidemia)   . HTN (hypertension)   . OA (osteoarthritis)   . DVT (deep venous thrombosis) 11/2004    during hospitalization    Past Surgical History  Procedure Laterality Date  . Liver abscess  12/03/04    strep with strep sepsis  . Esophagogastroduodenoscopy  04/2006    No family history on file.  History   Social History  . Marital Status: Widowed    Spouse Name: N/A    Number of Children: 7  . Years of Education: N/A   Occupational History  . Retired-housekeeper at the hospital    Social History Main Topics  . Smoking status: Never Smoker   . Smokeless tobacco: Never Used  . Alcohol Use: No  . Drug Use: Not on file  . Sexual Activity: Not on file    Other Topics Concern  . Not on file   Social History Narrative  . No narrative on file   Review of Systems Appetite is fine Weight is stable Sleeps well    Objective:   Physical Exam  Constitutional: She appears well-developed and well-nourished. No distress.  Neck: Normal range of motion. Neck supple. No thyromegaly present.  Cardiovascular: Normal rate, regular rhythm and normal heart sounds.  Exam reveals no gallop.   No murmur heard. Faint pedal pulse on right, absent on left but good perfusion  Pulmonary/Chest: Effort normal and breath sounds normal. No respiratory distress. She has no wheezes. She has no rales.  Abdominal: Soft. There is no tenderness.  Musculoskeletal: She exhibits no edema and no tenderness.  Lymphadenopathy:    She has no cervical adenopathy.  Skin:  Mycotic toenails No foot ulcers  Psychiatric: She has a normal mood and affect. Her behavior is normal.          Assessment & Plan:

## 2013-07-20 NOTE — Assessment & Plan Note (Signed)
Doesn't check at home Hopefully still good control

## 2013-07-20 NOTE — Assessment & Plan Note (Signed)
Limited mobility but doesn't think her pain is bad

## 2013-07-20 NOTE — Assessment & Plan Note (Signed)
BP Readings from Last 3 Encounters:  07/20/13 140/60  01/12/13 132/66  07/15/12 132/60   Good control No changes needed

## 2013-07-20 NOTE — Assessment & Plan Note (Signed)
Mild and stable Just on ASA

## 2013-07-22 ENCOUNTER — Encounter: Payer: Self-pay | Admitting: Family Medicine

## 2013-07-25 ENCOUNTER — Other Ambulatory Visit: Payer: Self-pay | Admitting: Internal Medicine

## 2013-07-27 NOTE — Telephone Encounter (Signed)
Okay #90 x 0 

## 2013-07-27 NOTE — Telephone Encounter (Signed)
rx called into pharmacy

## 2013-07-30 ENCOUNTER — Other Ambulatory Visit: Payer: Self-pay | Admitting: Internal Medicine

## 2013-09-15 ENCOUNTER — Other Ambulatory Visit: Payer: Self-pay | Admitting: Internal Medicine

## 2013-09-15 NOTE — Telephone Encounter (Signed)
Last filled 07/25/13

## 2013-09-15 NOTE — Telephone Encounter (Signed)
Okay #90 x 0 

## 2013-09-15 NOTE — Telephone Encounter (Signed)
rx called into pharmacy

## 2013-10-30 ENCOUNTER — Other Ambulatory Visit: Payer: Self-pay | Admitting: Internal Medicine

## 2013-10-30 NOTE — Telephone Encounter (Signed)
Last filled 09/15/13

## 2013-10-31 NOTE — Telephone Encounter (Signed)
Okay #90 x 0 

## 2013-11-02 NOTE — Telephone Encounter (Signed)
rx called into pharmacy

## 2013-12-19 ENCOUNTER — Other Ambulatory Visit: Payer: Self-pay | Admitting: Internal Medicine

## 2013-12-21 NOTE — Telephone Encounter (Signed)
Okay #90 x 0 

## 2013-12-21 NOTE — Telephone Encounter (Signed)
Last filled 10/30/13 

## 2013-12-21 NOTE — Telephone Encounter (Signed)
rx called into pharmacy

## 2014-01-18 ENCOUNTER — Ambulatory Visit (INDEPENDENT_AMBULATORY_CARE_PROVIDER_SITE_OTHER): Payer: Medicare Other | Admitting: Internal Medicine

## 2014-01-18 ENCOUNTER — Encounter: Payer: Self-pay | Admitting: Internal Medicine

## 2014-01-18 VITALS — BP 120/70 | HR 66 | Temp 98.0°F | Wt 146.0 lb

## 2014-01-18 DIAGNOSIS — I10 Essential (primary) hypertension: Secondary | ICD-10-CM | POA: Diagnosis not present

## 2014-01-18 DIAGNOSIS — E119 Type 2 diabetes mellitus without complications: Secondary | ICD-10-CM

## 2014-01-18 DIAGNOSIS — R220 Localized swelling, mass and lump, head: Secondary | ICD-10-CM | POA: Diagnosis not present

## 2014-01-18 DIAGNOSIS — Z0000 Encounter for general adult medical examination without abnormal findings: Secondary | ICD-10-CM | POA: Diagnosis not present

## 2014-01-18 DIAGNOSIS — F0150 Vascular dementia without behavioral disturbance: Secondary | ICD-10-CM

## 2014-01-18 DIAGNOSIS — R221 Localized swelling, mass and lump, neck: Secondary | ICD-10-CM | POA: Insufficient documentation

## 2014-01-18 DIAGNOSIS — M1710 Unilateral primary osteoarthritis, unspecified knee: Secondary | ICD-10-CM

## 2014-01-18 DIAGNOSIS — E785 Hyperlipidemia, unspecified: Secondary | ICD-10-CM | POA: Diagnosis not present

## 2014-01-18 DIAGNOSIS — IMO0002 Reserved for concepts with insufficient information to code with codable children: Secondary | ICD-10-CM

## 2014-01-18 LAB — CBC WITH DIFFERENTIAL/PLATELET
BASOS ABS: 0.1 10*3/uL (ref 0.0–0.1)
Basophils Relative: 1.3 % (ref 0.0–3.0)
Eosinophils Absolute: 0.1 10*3/uL (ref 0.0–0.7)
Eosinophils Relative: 1.2 % (ref 0.0–5.0)
HEMATOCRIT: 39.9 % (ref 36.0–46.0)
Hemoglobin: 13 g/dL (ref 12.0–15.0)
Lymphocytes Relative: 22.2 % (ref 12.0–46.0)
Lymphs Abs: 1.6 10*3/uL (ref 0.7–4.0)
MCHC: 32.6 g/dL (ref 30.0–36.0)
MCV: 97.6 fl (ref 78.0–100.0)
MONOS PCT: 10.8 % (ref 3.0–12.0)
Monocytes Absolute: 0.8 10*3/uL (ref 0.1–1.0)
Neutro Abs: 4.6 10*3/uL (ref 1.4–7.7)
Neutrophils Relative %: 64.5 % (ref 43.0–77.0)
PLATELETS: 155 10*3/uL (ref 150.0–400.0)
RBC: 4.09 Mil/uL (ref 3.87–5.11)
RDW: 18 % — AB (ref 11.5–14.6)
WBC: 7.1 10*3/uL (ref 4.5–10.5)

## 2014-01-18 LAB — COMPREHENSIVE METABOLIC PANEL
ALBUMIN: 3.9 g/dL (ref 3.5–5.2)
ALT: 14 U/L (ref 0–35)
AST: 19 U/L (ref 0–37)
Alkaline Phosphatase: 53 U/L (ref 39–117)
BILIRUBIN TOTAL: 0.7 mg/dL (ref 0.3–1.2)
BUN: 17 mg/dL (ref 6–23)
CO2: 28 mEq/L (ref 19–32)
Calcium: 9.3 mg/dL (ref 8.4–10.5)
Chloride: 103 mEq/L (ref 96–112)
Creatinine, Ser: 1 mg/dL (ref 0.4–1.2)
GFR: 67.84 mL/min (ref >60.00)
Glucose, Bld: 100 mg/dL — ABNORMAL HIGH (ref 70–99)
POTASSIUM: 3.4 meq/L — AB (ref 3.5–5.1)
SODIUM: 139 meq/L (ref 135–145)
TOTAL PROTEIN: 7.3 g/dL (ref 6.0–8.3)

## 2014-01-18 LAB — LIPID PANEL
Cholesterol: 229 mg/dL — ABNORMAL HIGH (ref 0–200)
HDL: 68.1 mg/dL (ref >39.00)
LDL Cholesterol: 135 mg/dL — ABNORMAL HIGH (ref 0–99)
Total CHOL/HDL Ratio: 3
Triglycerides: 131 mg/dL (ref 0.0–149.0)
VLDL: 26.2 mg/dL (ref 0.0–40.0)

## 2014-01-18 LAB — HM DIABETES FOOT EXAM

## 2014-01-18 LAB — T4, FREE: FREE T4: 0.74 ng/dL (ref 0.60–1.60)

## 2014-01-18 LAB — HEMOGLOBIN A1C: HEMOGLOBIN A1C: 6.6 % — AB (ref 4.6–6.5)

## 2014-01-18 LAB — MICROALBUMIN / CREATININE URINE RATIO
CREATININE, U: 75.3 mg/dL
Microalb Creat Ratio: 0.3 mg/g (ref 0.0–30.0)
Microalb, Ur: 0.2 mg/dL (ref 0.0–1.9)

## 2014-01-18 LAB — TSH: TSH: 2.08 u[IU]/mL (ref 0.35–5.50)

## 2014-01-18 MED ORDER — AMLODIPINE BESYLATE 10 MG PO TABS: 10.0000 mg | ORAL_TABLET | Freq: Every day | ORAL | Status: DC

## 2014-01-18 NOTE — Progress Notes (Signed)
Subjective:    Patient ID: Jeanette [name], female    DOB: [birth year], 80 y.o.   MRN: [id number]  HPI Here with daughter Jeanette [name] Medicare wellness and follow up No falls--needs wheelchair for any extended time out of house Reviewed advanced directives--did request DNR No other doctors at present No exercise No tobacco or alcohol Lives with daughter Jeanette [name] (recently widowed). She does all instrumental ADLs for her--- but Ms Jeanette [name] does do her ADLs. Generally continent Ongoing memory problems---no progression. No change in her abilities to do things Reviewed her sheet  Still doesn't check sugars Has sore spot on right 3rd toe--by the nail No drainage---just a thick toenail  No chest pain No SOB No palpitations  Some arthritis pain Uses the tramadol at night  Current Outpatient Prescriptions on File Prior to Visit  Medication Sig Dispense Refill  . aspirin 81 MG tablet Take 81 mg by mouth daily.        . Cod Liver Oil OIL Take by mouth as directed.        . hydrALAZINE (APRESOLINE) 50 MG tablet TAKE 1 TABLET TWICE A DAY  60 tablet  11  . traMADol (ULTRAM) 50 MG tablet TAKE 1 TABLET BY MOUTH 3 TIMES A DAY  90 tablet  0  . triamterene-hydrochlorothiazide (MAXZIDE-25) 37.5-25 MG per tablet TAKE 1 TABLET BY MOUTH DAILY  30 tablet  11   No current facility-administered medications on file prior to visit.    No Known Allergies  Past Medical History  Diagnosis Date  . Diabetes mellitus type II   . HLD (hyperlipidemia)   . HTN (hypertension)   . OA (osteoarthritis)   . DVT (deep venous thrombosis) 11/2004    during hospitalization    Past Surgical History  Procedure Laterality Date  . Liver abscess  12/03/04    strep with strep sepsis  . Esophagogastroduodenoscopy  04/2006    No family history on file.  History   Social History  . Marital Status: Widowed    Spouse Name: N/A    Number of Children: 7  . Years of Education: N/A   Occupational History  .  Retired-housekeeper at the hospital    Social History Main Topics  . Smoking status: Never Smoker   . Smokeless tobacco: Never Used  . Alcohol Use: No  . Drug Use: Not on file  . Sexual Activity: Not on file   Other Topics Concern  . Not on file   Social History Narrative   No living will   Requests daughter Jeanette [name], or son Jeanette [name], to make medical decisions for her   Requests DNR after discussion   Would leave decisions about tube feeds to children   Review of Systems Appetite is still very good Weight fairly stable Sleeps well Bowels have been okay    Objective:   Physical Exam  Constitutional: She appears well-developed and well-nourished. No distress.  HENT:  Mouth/Throat: Oropharynx is clear and moist. No oropharyngeal exudate.  No oral lesions  Neck: Normal range of motion. Neck supple. No thyromegaly present.  Large mass on right lower neck---not thyroid Firm but not tender or hard ~4x4cm and 2cm raised  Cardiovascular: Normal rate, regular rhythm and normal heart sounds.  Exam reveals no gallop.   No murmur heard. Faint pedal pulses  Pulmonary/Chest: Effort normal and breath sounds normal. No respiratory distress. She has no wheezes. She has no rales.  Abdominal: Soft. There is no tenderness.  Musculoskeletal: She exhibits no edema and  no tenderness.  Lymphadenopathy:    She has no cervical adenopathy.  Skin: No rash noted.  Several large mycotic toenails---esp right 3rd No infection No ulcers  Psychiatric: She has a normal mood and affect. Her behavior is normal.          Assessment & Plan:

## 2014-01-18 NOTE — Assessment & Plan Note (Signed)
Cognition stable Still does her own ADLs No Rx except ASA

## 2014-01-18 NOTE — Assessment & Plan Note (Signed)
Doesn't check Hopefully still good control

## 2014-01-18 NOTE — Assessment & Plan Note (Signed)
BP Readings from Last 3 Encounters:  01/18/14 120/70  07/20/13 140/60  01/12/13 132/66   Good control No changes for now

## 2014-01-18 NOTE — Assessment & Plan Note (Signed)
She prefers no Rx I think this is appropriate

## 2014-01-18 NOTE — Assessment & Plan Note (Signed)
Uses the tramadol 

## 2014-01-18 NOTE — Assessment & Plan Note (Signed)
New since last visit No pain or symptoms Family doesn't think they want to pursue any workup If so, would start with ultrasound (and ?ENT for biopsy)

## 2014-01-18 NOTE — Assessment & Plan Note (Signed)
I have personally reviewed the Medicare Annual Wellness questionnaire and have noted 1. The patient's medical and social history 2. Their use of alcohol, tobacco or illicit drugs 3. Their current medications and supplements 4. The patient's functional ability including ADL's, fall risks, home safety risks and hearing or visual             impairment. 5. Diet and physical activities 6. Evidence for depression or mood disorders  The patients weight, height, BMI and visual acuity have been recorded in the chart I have made referrals, counseling and provided education to the patient based review of the above and I have provided the pt with a written personalized care plan for preventive services.  I have provided you with a copy of your personalized plan for preventive services. Please take the time to review along with your updated medication list.  She prefers no vaccines Ongoing cognitive problems No cancer screening

## 2014-01-18 NOTE — Patient Instructions (Signed)
Please set up an appointment with [facility] to get toenails trimmed. Call me if you decide to pursue testing the the neck mass  Diabetes Meal Planning Guide The diabetes meal planning guide is a tool to help you plan your meals and snacks. It is important for people with diabetes to manage their blood glucose (sugar) levels. Choosing the right foods and the right amounts throughout your day will help control your blood glucose. Eating right can even help you improve your blood pressure and reach or maintain a healthy weight. CARBOHYDRATE COUNTING MADE EASY When you eat carbohydrates, they turn to sugar. This raises your blood glucose level. Counting carbohydrates can help you control this level so you feel better. When you plan your meals by counting carbohydrates, you can have more flexibility in what you eat and balance your medicine with your food intake. Carbohydrate counting simply means adding up the total amount of carbohydrate grams in your meals and snacks. Try to eat about the same amount at each meal. Foods with carbohydrates are listed below. Each portion below is 1 carbohydrate serving or 15 grams of carbohydrates. Ask your dietician how many grams of carbohydrates you should eat at each meal or snack. Grains and Starches  1 slice bread.   English muffin or hotdog/hamburger bun.   cup cold cereal (unsweetened).   cup cooked pasta or rice.   cup starchy vegetables (corn, potatoes, peas, beans, winter squash).  1 tortilla (6 inches).   bagel.  1 waffle or pancake (size of a CD).   cup cooked cereal.  4 to 6 small crackers. *Whole grain is recommended. Fruit  1 cup fresh unsweetened berries, melon, papaya, pineapple.  1 small fresh fruit.   banana or mango.   cup fruit juice (4 oz unsweetened).   cup canned fruit in natural juice or water.  2 tbs dried fruit.  12 to 15 grapes or cherries. Milk and Yogurt  1 cup fat-free or 1% milk.  1 cup soy  milk.  6 oz light yogurt with sugar-free sweetener.  6 oz low-fat soy yogurt.  6 oz plain yogurt. Vegetables  1 cup raw or  cup cooked is counted as 0 carbohydrates or a "free" food.  If you eat 3 or more servings at 1 meal, count them as 1 carbohydrate serving. Other Carbohydrates   oz chips or pretzels.   cup ice cream or frozen yogurt.   cup sherbet or sorbet.  2 inch square cake, no frosting.  1 tbs honey, sugar, jam, jelly, or syrup.  2 small cookies.  3 squares of graham crackers.  3 cups popcorn.  6 crackers.  1 cup broth-based soup.  Count 1 cup casserole or other mixed foods as 2 carbohydrate servings.  Foods with less than 20 calories in a serving may be counted as 0 carbohydrates or a "free" food. You may want to purchase a book or computer software that lists the carbohydrate gram counts of different foods. In addition, the nutrition facts panel on the labels of the foods you eat are a good source of this information. The label will tell you how big the serving size is and the total number of carbohydrate grams you will be eating per serving. Divide this number by 15 to obtain the number of carbohydrate servings in a portion. Remember, 1 carbohydrate serving equals 15 grams of carbohydrate. SERVING SIZES Measuring foods and serving sizes helps you make sure you are getting the right amount of food.  The list below tells how big or small some common serving sizes are.  1 oz.........4 stacked dice.  3 oz........Marland KitchenDeck of cards.  1 tsp.......Marland KitchenTip of little finger.  1 tbs......Marland KitchenMarland KitchenThumb.  2 tbs.......Marland KitchenGolf ball.   cup......Marland KitchenHalf of a fist.  1 cup.......Marland KitchenA fist. SAMPLE DIABETES MEAL PLAN Below is a sample meal plan that includes foods from the grain and starches, dairy, vegetable, fruit, and meat groups. A dietician can individualize a meal plan to fit your calorie needs and tell you the number of servings needed from each food group. However,  controlling the total amount of carbohydrates in your meal or snack is more important than making sure you include all of the food groups at every meal. You may interchange carbohydrate containing foods (dairy, starches, and fruits). The meal plan below is an example of a 2000 calorie diet using carbohydrate counting. This meal plan has 17 carbohydrate servings. Breakfast  1 cup oatmeal (2 carb servings).   cup light yogurt (1 carb serving).  1 cup blueberries (1 carb serving).   cup almonds. Snack  1 large apple (2 carb servings).  1 low-fat string cheese stick. Lunch  Chicken breast salad.  1 cup spinach.   cup chopped tomatoes.  2 oz chicken breast, sliced.  2 tbs low-fat [place] dressing.  12 whole-wheat crackers (2 carb servings).  12 to 15 grapes (1 carb serving).  1 cup low-fat milk (1 carb serving). Snack  1 cup carrots.   cup hummus (1 carb serving). Dinner  3 oz broiled salmon.  1 cup brown rice (3 carb servings). Snack  1  cups steamed broccoli (1 carb serving) drizzled with 1 tsp olive oil and lemon juice.  1 cup light pudding (2 carb servings). DIABETES MEAL PLANNING WORKSHEET Your dietician can use this worksheet to help you decide how many servings of foods and what types of foods are right for you.  BREAKFAST Food Group and Servings / Carb Servings Grain/Starches __________________________________ Dairy __________________________________________ Vegetable ______________________________________ Fruit ___________________________________________ Meat __________________________________________ Fat ____________________________________________ LUNCH Food Group and Servings / Carb Servings Grain/Starches ___________________________________ Dairy ___________________________________________ Fruit ____________________________________________ Meat ___________________________________________ Fat  _____________________________________________ Laural Golden Food Group and Servings / Carb Servings Grain/Starches ___________________________________ Dairy ___________________________________________ Fruit ____________________________________________ Meat ___________________________________________ Fat _____________________________________________ SNACKS Food Group and Servings / Carb Servings Grain/Starches ___________________________________ Dairy ___________________________________________ Vegetable _______________________________________ Fruit ____________________________________________ Meat ___________________________________________ Fat _____________________________________________ DAILY TOTALS Starches _________________________ Vegetable ________________________ Fruit ____________________________ Dairy ____________________________ Meat ____________________________ Fat ______________________________ Document Released: 07/26/2005 Document Revised: 01/21/2012 Document Reviewed: 06/06/2009 ExitCare Patient Information 2014 [employer].

## 2014-01-18 NOTE — Progress Notes (Signed)
Pre visit review using our clinic review tool, if applicable. No additional management support is needed unless otherwise documented below in the visit note. 

## 2014-01-19 ENCOUNTER — Telehealth: Payer: Self-pay | Admitting: Internal Medicine

## 2014-01-19 NOTE — Telephone Encounter (Signed)
Relevant patient education mailed to patient.  

## 2014-01-25 ENCOUNTER — Telehealth: Payer: Self-pay

## 2014-01-25 NOTE — Telephone Encounter (Signed)
Pt's daughter left v/m she received an empty envelope with return mail address [address] [place], [place]. [facility] [place] and she sends out [employer] educational material after PCP visit; Judeth CornfieldStephanie will resend. Pt's daughter notified.

## 2014-02-03 ENCOUNTER — Encounter: Payer: Self-pay | Admitting: Podiatrist

## 2014-02-03 ENCOUNTER — Ambulatory Visit (INDEPENDENT_AMBULATORY_CARE_PROVIDER_SITE_OTHER): Payer: Medicare Other | Admitting: Podiatrist

## 2014-02-03 VITALS — BP 131/56 | HR 79 | Resp 16 | Ht 62.5 in | Wt 146.0 lb

## 2014-02-03 DIAGNOSIS — E1140 Type 2 diabetes mellitus with diabetic neuropathy, unspecified: Secondary | ICD-10-CM

## 2014-02-03 DIAGNOSIS — M79609 Pain in unspecified limb: Secondary | ICD-10-CM

## 2014-02-03 DIAGNOSIS — E1142 Type 2 diabetes mellitus with diabetic polyneuropathy: Secondary | ICD-10-CM | POA: Diagnosis not present

## 2014-02-03 DIAGNOSIS — E1149 Type 2 diabetes mellitus with other diabetic neurological complication: Secondary | ICD-10-CM

## 2014-02-03 DIAGNOSIS — B351 Tinea unguium: Secondary | ICD-10-CM | POA: Diagnosis not present

## 2014-02-03 NOTE — Progress Notes (Signed)
   Subjective:    Patient ID: Jeanette [name], female    DOB: 05-28-1917, 80 y.o.   MRN: [id number]  HPI Comments: "Her toenails"  Patient needs her toenails trimmed. Thick and discolored. Not painful.  Patient is diabetic and relates some tingling and numbness of bilateral feet at times.  Overall she states she is having no problems with her feet today besides the toenails being too long.   Review of Systems  Musculoskeletal: Positive for arthralgias, gait problem and myalgias.  All other systems reviewed and are negative.       Objective:   Physical Exam  GENERAL APPEARANCE: Alert, conversant. Appropriately groomed. No acute distress.  VASCULAR: Pedal pulses palpable at 2/4 DP and PT bilateral.  Capillary refill time is immediate to all digits,  Proximal to distal cooling it warm to warm.  NEUROLOGIC: sensation is decreased epicritically and protectively to 5.07 monofilament at 3/5 sites bilateral.  Light touch is intact bilateral, vibratory sensation intact bilateral,  MUSCULOSKELETAL: acceptable muscle strength, tone and stability bilateral.  Intrinsic muscluature intact bilateral.   DERMATOLOGIC: Patient's toenails are thickened, discolored, dystrophic, and elongated and ingrown on the medial and lateral nail borders especially bilateral first. No preulcerative lesions are seen, no interdigital maceration noted.  No open lesions present.       Assessment & Plan:  Symptomatic onychomycosis, diabetes with neuropathy Plan: Debridement was carried out today without complication of her toenails. No iatrogenic bleeding was noted. She tolerated this well. She'll be seen back as needed for followup or in 3 months for routine care.

## 2014-02-03 NOTE — Patient Instructions (Signed)
Diabetes and Foot Care Diabetes may cause you to have problems because of poor blood supply (circulation) to your feet and legs. This may cause the skin on your feet to become thinner, break easier, and heal more slowly. Your skin may become dry, and the skin may peel and crack. You may also have nerve damage in your legs and feet causing decreased feeling in them. You may not notice minor injuries to your feet that could lead to infections or more serious problems. Taking care of your feet is one of the most important things you can do for yourself.  HOME CARE INSTRUCTIONS  Wear shoes at all times, even in the house. Do not go barefoot. Bare feet are easily injured.  Check your feet daily for blisters, cuts, and redness. If you cannot see the bottom of your feet, use a mirror or ask someone for help.  Wash your feet with warm water (do not use hot water) and mild soap. Then pat your feet and the areas between your toes until they are completely dry. Do not soak your feet as this can dry your skin.  Apply a moisturizing lotion or petroleum jelly (that does not contain alcohol and is unscented) to the skin on your feet and to dry, brittle toenails. Do not apply lotion between your toes.  Trim your toenails straight across. Do not dig under them or around the cuticle. File the edges of your nails with an emery board or nail file.  Do not cut corns or calluses or try to remove them with medicine.  Wear clean socks or stockings every day. Make sure they are not too tight. Do not wear knee-high stockings since they may decrease blood flow to your legs.  Wear shoes that fit properly and have enough cushioning. To break in new shoes, wear them for just a few hours a day. This prevents you from injuring your feet. Always look in your shoes before you put them on to be sure there are no objects inside.  Do not cross your legs. This may decrease the blood flow to your feet.  If you find a minor scrape,  cut, or break in the skin on your feet, keep it and the skin around it clean and dry. These areas may be cleansed with mild soap and water. Do not cleanse the area with peroxide, alcohol, or iodine.  When you remove an adhesive bandage, be sure not to damage the skin around it.  If you have a wound, look at it several times a day to make sure it is healing.  Do not use heating pads or hot water bottles. They may burn your skin. If you have lost feeling in your feet or legs, you may not know it is happening until it is too late.  Make sure your health care provider performs a complete foot exam at least annually or more often if you have foot problems. Report any cuts, sores, or bruises to your health care provider immediately. SEEK MEDICAL CARE IF:   You have an injury that is not healing.  You have cuts or breaks in the skin.  You have an ingrown nail.  You notice redness on your legs or feet.  You feel burning or tingling in your legs or feet.  You have pain or cramps in your legs and feet.  Your legs or feet are numb.  Your feet always feel cold. SEEK IMMEDIATE MEDICAL CARE IF:   There is increasing redness,   swelling, or pain in or around a wound.  There is a red line that goes up your leg.  Pus is coming from a wound.  You develop a fever or as directed by your health care provider.  You notice a bad smell coming from an ulcer or wound. Document Released: 10/26/2000 Document Revised: 07/01/2013 Document Reviewed: 04/07/2013 ExitCare Patient Information 2014 [employer].  

## 2014-03-20 ENCOUNTER — Other Ambulatory Visit: Payer: Self-pay | Admitting: Internal Medicine

## 2014-03-20 NOTE — Telephone Encounter (Signed)
12/19/2013 

## 2014-03-21 NOTE — Telephone Encounter (Signed)
Okay #90 x 0 Needs to set up follow up sometime soon

## 2014-03-22 NOTE — Telephone Encounter (Signed)
rx called into pharmacy

## 2014-04-20 ENCOUNTER — Other Ambulatory Visit: Payer: Self-pay | Admitting: Internal Medicine

## 2014-05-21 ENCOUNTER — Other Ambulatory Visit: Payer: Self-pay | Admitting: Internal Medicine

## 2014-05-21 NOTE — Telephone Encounter (Signed)
rx called into pharmacy

## 2014-05-21 NOTE — Telephone Encounter (Signed)
03/22/14 

## 2014-05-21 NOTE — Telephone Encounter (Signed)
Okay #90 x 0 

## 2014-06-21 ENCOUNTER — Other Ambulatory Visit: Payer: Self-pay | Admitting: Internal Medicine

## 2014-06-30 ENCOUNTER — Other Ambulatory Visit: Payer: Self-pay | Admitting: Internal Medicine

## 2014-06-30 NOTE — Telephone Encounter (Signed)
rx called into pharmacy

## 2014-06-30 NOTE — Telephone Encounter (Signed)
Okay #90 x 0 

## 2014-06-30 NOTE — Telephone Encounter (Signed)
05/21/14 

## 2014-07-28 ENCOUNTER — Encounter: Payer: Self-pay | Admitting: Internal Medicine

## 2014-07-28 ENCOUNTER — Encounter: Payer: Self-pay | Admitting: Podiatrist

## 2014-07-28 ENCOUNTER — Ambulatory Visit (INDEPENDENT_AMBULATORY_CARE_PROVIDER_SITE_OTHER): Payer: Medicare Other | Admitting: Internal Medicine

## 2014-07-28 ENCOUNTER — Ambulatory Visit (INDEPENDENT_AMBULATORY_CARE_PROVIDER_SITE_OTHER): Payer: Medicare Other | Admitting: Podiatrist

## 2014-07-28 VITALS — BP 127/63 | HR 74 | Resp 18

## 2014-07-28 VITALS — BP 138/60 | HR 76 | Temp 98.1°F | Wt 140.0 lb

## 2014-07-28 DIAGNOSIS — B351 Tinea unguium: Secondary | ICD-10-CM

## 2014-07-28 DIAGNOSIS — F0150 Vascular dementia without behavioral disturbance: Secondary | ICD-10-CM | POA: Diagnosis not present

## 2014-07-28 DIAGNOSIS — I10 Essential (primary) hypertension: Secondary | ICD-10-CM | POA: Diagnosis not present

## 2014-07-28 DIAGNOSIS — R221 Localized swelling, mass and lump, neck: Secondary | ICD-10-CM

## 2014-07-28 DIAGNOSIS — M79609 Pain in unspecified limb: Secondary | ICD-10-CM | POA: Diagnosis not present

## 2014-07-28 DIAGNOSIS — R220 Localized swelling, mass and lump, head: Secondary | ICD-10-CM

## 2014-07-28 DIAGNOSIS — E119 Type 2 diabetes mellitus without complications: Secondary | ICD-10-CM

## 2014-07-28 DIAGNOSIS — M1990 Unspecified osteoarthritis, unspecified site: Secondary | ICD-10-CM

## 2014-07-28 DIAGNOSIS — M79676 Pain in unspecified toe(s): Principal | ICD-10-CM

## 2014-07-28 LAB — HEMOGLOBIN A1C: Hgb A1c MFr Bld: 6.4 % (ref 4.6–6.5)

## 2014-07-28 MED ORDER — HYDRALAZINE HCL 50 MG PO TABS: 50.0000 mg | ORAL_TABLET | Freq: Two times a day (BID) | ORAL | Status: DC

## 2014-07-28 NOTE — Assessment & Plan Note (Signed)
BP Readings from Last 3 Encounters:  07/28/14 138/60  02/03/14 131/56  01/18/14 120/70   Good control still

## 2014-07-28 NOTE — Assessment & Plan Note (Signed)
Mild and non progressive 

## 2014-07-28 NOTE — Progress Notes (Signed)
HPI: Patient presents today for follow up of diabetic foot and nail care. Past medical history, meds, and allergies reviewed. Patient states blood sugar is under fair  control.   Objective:   Objective:  Patients chart is reviewed.  Vascular status reveals pedal pulses noted at  2 out of 4 dp and pt bilateral .  Neurological sensation is Decreased to Semmes Weinstein monofilament bilateral at 3/5 sites bilateral.  Dermatological exam reveals  absence of pre ulcerative/ hyperkeratotic lesions.   Toenails are elongated, incurvated, discolored, dystrophic with ingrown deformity present.    Assessment: Diabetes with Neuropathy , Ingrown nail deformity,   Plan: Discussed treatment options and alternatives. Debrided nails without complication.   Return appointment recommended at routine intervals of 3 months.          

## 2014-07-28 NOTE — Assessment & Plan Note (Signed)
Does well with the tramadol at night

## 2014-07-28 NOTE — Progress Notes (Signed)
   Subjective:    Patient ID: Jeanette [name], female    DOB: [birth year], 80 y.o.   MRN: [id number]  HPI Here with daughter, Jeanette [name]  No change in neck mass No pain  Appetite is still good Weight is down 6# though Doesn't check sugars  Uses walker at home Wheelchair if she goes out Gets help with shower but independent otherwise with ADLs  No chest pain No SOB No edema No dizziness or syncope  No major issues with arthritis pain Uses tramadol at night to help sleep  Current Outpatient Prescriptions on File Prior to Visit  Medication Sig Dispense Refill  . amLODipine (NORVASC) 10 MG tablet TAKE 1 TABLET BY MOUTH DAILY  30 tablet  6  . aspirin 81 MG tablet Take 81 mg by mouth daily.        . Cod Liver Oil OIL Take by mouth as directed.        . traMADol (ULTRAM) 50 MG tablet TAKE 1 TABLET BY MOUTH 3 TIMES A DAY  90 tablet  0  . triamterene-hydrochlorothiazide (MAXZIDE-25) 37.5-25 MG per tablet TAKE 1 TABLET BY MOUTH DAILY  30 tablet  11   No current facility-administered medications on file prior to visit.    No Known Allergies  Past Medical History  Diagnosis Date  . Diabetes mellitus type II   . HLD (hyperlipidemia)   . HTN (hypertension)   . OA (osteoarthritis)   . DVT (deep venous thrombosis) 11/2004    during hospitalization    Past Surgical History  Procedure Laterality Date  . Liver abscess  12/03/04    strep with strep sepsis  . Esophagogastroduodenoscopy  04/2006    No family history on file.  History   Social History  . Marital Status: Widowed    Spouse Name: N/A    Number of Children: 7  . Years of Education: N/A   Occupational History  . Retired-housekeeper at the hospital    Social History Main Topics  . Smoking status: Never Smoker   . Smokeless tobacco: Never Used  . Alcohol Use: No  . Drug Use: Not on file  . Sexual Activity: Not on file   Other Topics Concern  . Not on file   Social History Narrative   No living will   Requests daughter Jeanette [name], or son Jeanette [name], to make medical decisions for her   Requests DNR after discussion   Would leave decisions about tube feeds to children   Review of Systems Bowels okay Sleeps well No sores or pain in feet--needs toenail clipping     Objective:   Physical Exam  Constitutional: She appears well-developed and well-nourished. No distress.  Neck: Normal range of motion. Neck supple. No thyromegaly present.  Large mass--hard and partially fixed on right Not thyroid No oral lesions or supraclavicular nodes  Cardiovascular: Normal rate, regular rhythm, normal heart sounds and intact distal pulses.  Exam reveals no gallop.   No murmur heard. Pulmonary/Chest: Effort normal and breath sounds normal. No respiratory distress. She has no wheezes. She has no rales.  Lymphadenopathy:    She has no cervical adenopathy.  Skin:  No foot lesions  Psychiatric: She has a normal mood and affect. Her behavior is normal.          Assessment & Plan:

## 2014-07-28 NOTE — Assessment & Plan Note (Addendum)
Hopefully still well controlled Still prefers no immunizations

## 2014-07-28 NOTE — Patient Instructions (Signed)
Diabetes and Foot Care Diabetes may cause you to have problems because of poor blood supply (circulation) to your feet and legs. This may cause the skin on your feet to become thinner, break easier, and heal more slowly. Your skin may become dry, and the skin may peel and crack. You may also have nerve damage in your legs and feet causing decreased feeling in them. You may not notice minor injuries to your feet that could lead to infections or more serious problems. Taking care of your feet is one of the most important things you can do for yourself.  HOME CARE INSTRUCTIONS  Wear shoes at all times, even in the house. Do not go barefoot. Bare feet are easily injured.  Check your feet daily for blisters, cuts, and redness. If you cannot see the bottom of your feet, use a mirror or ask someone for help.  Wash your feet with warm water (do not use hot water) and mild soap. Then pat your feet and the areas between your toes until they are completely dry. Do not soak your feet as this can dry your skin.  Apply a moisturizing lotion or petroleum jelly (that does not contain alcohol and is unscented) to the skin on your feet and to dry, brittle toenails. Do not apply lotion between your toes.  Trim your toenails straight across. Do not dig under them or around the cuticle. File the edges of your nails with an emery board or nail file.  Do not cut corns or calluses or try to remove them with medicine.  Wear clean socks or stockings every day. Make sure they are not too tight. Do not wear knee-high stockings since they may decrease blood flow to your legs.  Wear shoes that fit properly and have enough cushioning. To break in new shoes, wear them for just a few hours a day. This prevents you from injuring your feet. Always look in your shoes before you put them on to be sure there are no objects inside.  Do not cross your legs. This may decrease the blood flow to your feet.  If you find a minor scrape,  cut, or break in the skin on your feet, keep it and the skin around it clean and dry. These areas may be cleansed with mild soap and water. Do not cleanse the area with peroxide, alcohol, or iodine.  When you remove an adhesive bandage, be sure not to damage the skin around it.  If you have a wound, look at it several times a day to make sure it is healing.  Do not use heating pads or hot water bottles. They may burn your skin. If you have lost feeling in your feet or legs, you may not know it is happening until it is too late.  Make sure your health care provider performs a complete foot exam at least annually or more often if you have foot problems. Report any cuts, sores, or bruises to your health care provider immediately. SEEK MEDICAL CARE IF:   You have an injury that is not healing.  You have cuts or breaks in the skin.  You have an ingrown nail.  You notice redness on your legs or feet.  You feel burning or tingling in your legs or feet.  You have pain or cramps in your legs and feet.  Your legs or feet are numb.  Your feet always feel cold. SEEK IMMEDIATE MEDICAL CARE IF:   There is increasing redness,   swelling, or pain in or around a wound.  There is a red line that goes up your leg.  Pus is coming from a wound.  You develop a fever or as directed by your health care provider.  You notice a bad smell coming from an ulcer or wound. Document Released: 10/26/2000 Document Revised: 07/01/2013 Document Reviewed: 04/07/2013 ExitCare Patient Information 2015 [employer]. This information is not intended to replace advice given to you by your health care provider. Make sure you discuss any questions you have with your health care provider.  

## 2014-07-28 NOTE — Progress Notes (Signed)
Pre visit review using our clinic review tool, if applicable. No additional management support is needed unless otherwise documented below in the visit note. 

## 2014-07-28 NOTE — Assessment & Plan Note (Signed)
May be slightly bigger Still no symptoms Some weight loss--though eating well. Doesn't seem to have progressed in 6 months as would be expected with serious neoplasm Discussed work up again---they still wish to not do testing.

## 2014-07-29 LAB — RENAL FUNCTION PANEL
ALBUMIN: 3.7 g/dL (ref 3.5–5.2)
BUN: 20 mg/dL (ref 6–23)
CALCIUM: 9 mg/dL (ref 8.4–10.5)
CHLORIDE: 99 meq/L (ref 96–112)
CO2: 27 meq/L (ref 19–32)
Creatinine, Ser: 1.2 mg/dL (ref 0.4–1.2)
GFR: 55.78 mL/min — ABNORMAL LOW (ref >60.00)
Glucose, Bld: 206 mg/dL — ABNORMAL HIGH (ref 70–99)
POTASSIUM: 3.3 meq/L — AB (ref 3.5–5.1)
Phosphorus: 2.3 mg/dL (ref 2.3–4.6)
Sodium: 135 mEq/L (ref 135–145)

## 2014-07-30 ENCOUNTER — Encounter: Payer: Self-pay | Admitting: *Deleted

## 2014-08-23 ENCOUNTER — Other Ambulatory Visit: Payer: Self-pay | Admitting: Internal Medicine

## 2014-08-25 NOTE — Telephone Encounter (Signed)
Okay #90 x 0 

## 2014-08-25 NOTE — Telephone Encounter (Signed)
Rx called in as directed.   

## 2014-08-25 NOTE — Telephone Encounter (Signed)
Last filled 06/30/14 #90, last office visit 07/28/2014--please advise

## 2014-10-08 ENCOUNTER — Other Ambulatory Visit: Payer: Self-pay | Admitting: Internal Medicine

## 2014-10-08 NOTE — Telephone Encounter (Signed)
08/25/14 

## 2014-10-09 NOTE — Telephone Encounter (Signed)
Approved: #90 x 0 

## 2014-10-11 NOTE — Telephone Encounter (Signed)
rx called into pharmacy

## 2014-10-15 ENCOUNTER — Other Ambulatory Visit: Payer: Self-pay | Admitting: Internal Medicine

## 2014-10-28 ENCOUNTER — Encounter: Payer: Self-pay | Admitting: Podiatrist

## 2014-10-28 ENCOUNTER — Ambulatory Visit (INDEPENDENT_AMBULATORY_CARE_PROVIDER_SITE_OTHER): Payer: Medicare Other | Admitting: Podiatrist

## 2014-10-28 DIAGNOSIS — B351 Tinea unguium: Secondary | ICD-10-CM | POA: Diagnosis not present

## 2014-10-28 DIAGNOSIS — M79676 Pain in unspecified toe(s): Secondary | ICD-10-CM | POA: Diagnosis not present

## 2014-10-28 DIAGNOSIS — E1140 Type 2 diabetes mellitus with diabetic neuropathy, unspecified: Secondary | ICD-10-CM

## 2014-10-28 NOTE — Progress Notes (Signed)
HPI: Patient presents today for follow up of diabetic foot and nail care. Past medical history, meds, and allergies reviewed. Patient states blood sugar is under fair  control.   Objective:   Objective:  Patients chart is reviewed.  Vascular status reveals pedal pulses noted at  2 out of 4 dp and pt bilateral .  Neurological sensation is Decreased to [facility] monofilament bilateral at 3/5 sites bilateral.  Dermatological exam reveals  absence of pre ulcerative/ hyperkeratotic lesions.   Toenails are elongated, incurvated, discolored, dystrophic with ingrown deformity present.    Assessment: Diabetes with Neuropathy , Ingrown nail deformity,   Plan: Discussed treatment options and alternatives. Debrided nails without complication.   Return appointment recommended at routine intervals of 3 months.

## 2014-12-06 ENCOUNTER — Other Ambulatory Visit: Payer: Self-pay | Admitting: Internal Medicine

## 2014-12-06 NOTE — Telephone Encounter (Signed)
Approved: #90 x 0 

## 2014-12-06 NOTE — Telephone Encounter (Signed)
rx called into pharmacy

## 2014-12-06 NOTE — Telephone Encounter (Signed)
10/11/14 

## 2015-01-27 ENCOUNTER — Encounter: Payer: Self-pay | Admitting: Podiatrist

## 2015-01-27 ENCOUNTER — Ambulatory Visit (INDEPENDENT_AMBULATORY_CARE_PROVIDER_SITE_OTHER): Payer: Medicare Other | Admitting: Podiatrist

## 2015-01-27 DIAGNOSIS — M79676 Pain in unspecified toe(s): Secondary | ICD-10-CM

## 2015-01-27 DIAGNOSIS — E1140 Type 2 diabetes mellitus with diabetic neuropathy, unspecified: Secondary | ICD-10-CM

## 2015-01-27 DIAGNOSIS — B351 Tinea unguium: Secondary | ICD-10-CM | POA: Diagnosis not present

## 2015-01-27 NOTE — Progress Notes (Signed)
HPI: Patient presents today for follow up of diabetic foot and nail care. Past medical history, meds, and allergies reviewed. Patient states blood sugar is under fair  control.   Objective:   Objective:  Patients chart is reviewed.  Vascular status reveals pedal pulses noted at  2 out of 4 dp and pt bilateral .  Neurological sensation is Decreased to Semmes Weinstein monofilament bilateral at 3/5 sites bilateral.  Dermatological exam reveals  absence of pre ulcerative/ hyperkeratotic lesions.   Toenails are elongated, incurvated, discolored, dystrophic with ingrown deformity present.    Assessment: Diabetes with Neuropathy , Ingrown nail deformity,   Plan: Discussed treatment options and alternatives. Debrided nails without complication.   Return appointment recommended at routine intervals of 3 months.          

## 2015-02-02 ENCOUNTER — Ambulatory Visit (INDEPENDENT_AMBULATORY_CARE_PROVIDER_SITE_OTHER): Payer: Medicare Other | Admitting: Internal Medicine

## 2015-02-02 ENCOUNTER — Encounter: Payer: Self-pay | Admitting: Internal Medicine

## 2015-02-02 VITALS — BP 120/60 | HR 54 | Temp 98.6°F | Ht 63.0 in | Wt 136.0 lb

## 2015-02-02 DIAGNOSIS — Z0000 Encounter for general adult medical examination without abnormal findings: Secondary | ICD-10-CM | POA: Diagnosis not present

## 2015-02-02 DIAGNOSIS — F0150 Vascular dementia without behavioral disturbance: Secondary | ICD-10-CM

## 2015-02-02 DIAGNOSIS — Z7189 Other specified counseling: Secondary | ICD-10-CM | POA: Diagnosis not present

## 2015-02-02 DIAGNOSIS — I10 Essential (primary) hypertension: Secondary | ICD-10-CM | POA: Diagnosis not present

## 2015-02-02 DIAGNOSIS — R221 Localized swelling, mass and lump, neck: Secondary | ICD-10-CM | POA: Diagnosis not present

## 2015-02-02 DIAGNOSIS — E119 Type 2 diabetes mellitus without complications: Secondary | ICD-10-CM | POA: Diagnosis not present

## 2015-02-02 LAB — COMPREHENSIVE METABOLIC PANEL
ALBUMIN: 3.6 g/dL (ref 3.5–5.2)
ALK PHOS: 60 U/L (ref 39–117)
ALT: 11 U/L (ref 0–35)
AST: 22 U/L (ref 0–37)
BUN: 16 mg/dL (ref 6–23)
CHLORIDE: 101 meq/L (ref 96–112)
CO2: 33 mEq/L — ABNORMAL HIGH (ref 19–32)
CREATININE: 0.92 mg/dL (ref 0.40–1.20)
Calcium: 9.2 mg/dL (ref 8.4–10.5)
GFR: 72.81 mL/min (ref >60.00)
Glucose, Bld: 122 mg/dL — ABNORMAL HIGH (ref 70–99)
Potassium: 3.2 mEq/L — ABNORMAL LOW (ref 3.5–5.1)
Sodium: 139 mEq/L (ref 135–145)
TOTAL PROTEIN: 6.7 g/dL (ref 6.0–8.3)
Total Bilirubin: 0.7 mg/dL (ref 0.2–1.2)

## 2015-02-02 LAB — CBC WITH DIFFERENTIAL/PLATELET
BASOS ABS: 0.1 10*3/uL (ref 0.0–0.1)
Basophils Relative: 1 % (ref 0.0–3.0)
Eosinophils Absolute: 0 10*3/uL (ref 0.0–0.7)
Eosinophils Relative: 0.8 % (ref 0.0–5.0)
HEMATOCRIT: 38.5 % (ref 36.0–46.0)
HEMOGLOBIN: 12.9 g/dL (ref 12.0–15.0)
Lymphocytes Relative: 24.9 % (ref 12.0–46.0)
Lymphs Abs: 1.5 10*3/uL (ref 0.7–4.0)
MCHC: 33.5 g/dL (ref 30.0–36.0)
MCV: 95.6 fl (ref 78.0–100.0)
MONOS PCT: 10.8 % (ref 3.0–12.0)
Monocytes Absolute: 0.6 10*3/uL (ref 0.1–1.0)
Neutro Abs: 3.7 10*3/uL (ref 1.4–7.7)
Neutrophils Relative %: 62.5 % (ref 43.0–77.0)
Platelets: 177 10*3/uL (ref 150.0–400.0)
RBC: 4.02 Mil/uL (ref 3.87–5.11)
RDW: 17.8 % — AB (ref 11.5–15.5)
WBC: 6 10*3/uL (ref 4.0–10.5)

## 2015-02-02 LAB — LIPID PANEL
Cholesterol: 216 mg/dL — ABNORMAL HIGH (ref 0–200)
HDL: 58.4 mg/dL (ref >39.00)
LDL Cholesterol: 139 mg/dL — ABNORMAL HIGH (ref 0–99)
NonHDL: 157.6
Total CHOL/HDL Ratio: 4
Triglycerides: 92 mg/dL (ref 0.0–149.0)
VLDL: 18.4 mg/dL (ref 0.0–40.0)

## 2015-02-02 LAB — HEMOGLOBIN A1C: Hgb A1c MFr Bld: 6.3 % (ref 4.6–6.5)

## 2015-02-02 LAB — T4, FREE: FREE T4: 0.84 ng/dL (ref 0.60–1.60)

## 2015-02-02 LAB — HM DIABETES FOOT EXAM

## 2015-02-02 NOTE — Assessment & Plan Note (Signed)
Likely carcinoma but she doesn't want Rx --and doesn't seem to be changing much

## 2015-02-02 NOTE — Assessment & Plan Note (Signed)
BP Readings from Last 3 Encounters:  02/02/15 120/60  07/28/14 127/63  07/28/14 138/60   Good control No ACEI due to age, reluctance to change things and renal insuff.

## 2015-02-02 NOTE — Assessment & Plan Note (Signed)
Hopefully still good control No statin due to age

## 2015-02-02 NOTE — Assessment & Plan Note (Signed)
I have personally reviewed the Medicare Annual Wellness questionnaire and have noted 1. The patient's medical and social history 2. Their use of alcohol, tobacco or illicit drugs 3. Their current medications and supplements 4. The patient's functional ability including ADL's, fall risks, home safety risks and hearing or visual             impairment. 5. Diet and physical activities 6. Evidence for depression or mood disorders  The patients weight, height, BMI and visual acuity have been recorded in the chart I have made referrals, counseling and provided education to the patient based review of the above and I have provided the pt with a written personalized care plan for preventive services.  I have provided you with a copy of your personalized plan for preventive services. Please take the time to review along with your updated medication list.  No cancer screening due to age Does not want any vaccines

## 2015-02-02 NOTE — Assessment & Plan Note (Signed)
See social history °Has DNR °

## 2015-02-02 NOTE — Progress Notes (Signed)
Pre visit review using our clinic review tool, if applicable. No additional management support is needed unless otherwise documented below in the visit note. 

## 2015-02-02 NOTE — Assessment & Plan Note (Signed)
Progressive worsening Daughter doing okay but spoke to her about trying to get respite

## 2015-02-02 NOTE — Progress Notes (Signed)
Subjective:    Patient ID: Jeanette [name], female    DOB: 04-28-1917, 81 y.o.   MRN: [id number]  HPI Here with daughter for Medicare wellness and follow up Reviewed advanced directives No other doctors No hospitalizations or procedures in past year No tobacco or alcohol Doesn't really do exercise Needs help with all instrumental ADLs and most ADLs Vision is okay. Hearing still good No depression or anhedonia Has slid out of bed sometimes---has very low bed so no injuries Reviewed her med list  Lives with daughter Daughter has noted some cognitive decline Some incontinence of B/B Needs help with all personal care  Not checking sugars No meds for diabetes No recent eye exam No foot numbness or pain--did have nails done recently  mass on right neck seems about the same No pain No trouble swallowing  No chest pain No SOB Breathing has been fine No dizziness or syncope  Current Outpatient Prescriptions on File Prior to Visit  Medication Sig Dispense Refill  . amLODipine (NORVASC) 10 MG tablet TAKE 1 TABLET BY MOUTH DAILY 30 tablet 3  . hydrALAZINE (APRESOLINE) 50 MG tablet Take 1 tablet (50 mg total) by mouth 2 (two) times daily. 60 tablet 11  . traMADol (ULTRAM) 50 MG tablet TAKE 1 TABLET THREE TIMES A DAY 90 tablet 0  . triamterene-hydrochlorothiazide (MAXZIDE-25) 37.5-25 MG per tablet TAKE 1 TABLET BY MOUTH DAILY 30 tablet 11   No current facility-administered medications on file prior to visit.    No Known Allergies  Past Medical History  Diagnosis Date  . Diabetes mellitus type II   . HLD (hyperlipidemia)   . HTN (hypertension)   . OA (osteoarthritis)   . DVT (deep venous thrombosis) 11/2004    during hospitalization    Past Surgical History  Procedure Laterality Date  . Liver abscess  12/03/04    strep with strep sepsis  . Esophagogastroduodenoscopy  04/2006    No family history on file.  History   Social History  . Marital Status: Widowed   Spouse Name: N/A  . Number of Children: 7  . Years of Education: N/A   Occupational History  . Retired-housekeeper at the hospital    Social History Main Topics  . Smoking status: Never Smoker   . Smokeless tobacco: Never Used  . Alcohol Use: No  . Drug Use: Not on file  . Sexual Activity: Not on file   Other Topics Concern  . Not on file   Social History Narrative   No living will   Requests daughter Jeanette [name], or son Jeanette [name], to make medical decisions for her   Has DNR    Would leave decisions about tube feeds to children   Review of Systems Sleeps well Appetite is not great. Only 1 set meal then snacks. Discussed adding supplements like ensure or boost No joint pains Teeth are okay    Objective:   Physical Exam  Constitutional: She appears well-developed. No distress.  HENT:  Mouth/Throat: Oropharynx is clear and moist. No oropharyngeal exudate.  Neck: No thyromegaly present.  ~5x5 cm hard, partially adherent right neck mass---may be slightly bigger  Cardiovascular: Normal rate, regular rhythm, normal heart sounds and intact distal pulses.  Exam reveals no gallop.   No murmur heard. Pulmonary/Chest: Breath sounds normal. No respiratory distress. She has no wheezes. She has no rales.  Abdominal: Soft. There is no tenderness.  Musculoskeletal: She exhibits no edema or tenderness.  Lymphadenopathy:    She has no cervical  adenopathy.  Neurological:  Some confusion but engages in socially appropriate way Seems to have normal sensation on plantar feet  Skin: No rash noted. No erythema.  No foot lesions  Psychiatric: She has a normal mood and affect. Her behavior is normal.          Assessment & Plan:

## 2015-02-07 LAB — MICROALBUMIN / CREATININE URINE RATIO
Creatinine,U: 145.1 mg/dL
MICROALB UR: 1.8 mg/dL (ref 0.0–1.9)
MICROALB/CREAT RATIO: 1.2 mg/g (ref 0.0–30.0)

## 2015-02-07 NOTE — Addendum Note (Signed)
Addended by: Alvina ChouWALSH, TERRI J on: 02/07/2015 04:56 PM   Modules accepted: Orders

## 2015-02-08 ENCOUNTER — Encounter: Payer: Self-pay | Admitting: *Deleted

## 2015-02-15 ENCOUNTER — Other Ambulatory Visit: Payer: Self-pay | Admitting: Internal Medicine

## 2015-02-15 NOTE — Telephone Encounter (Signed)
Approved: #90 x 0 

## 2015-02-15 NOTE — Telephone Encounter (Signed)
12/06/2014 

## 2015-02-15 NOTE — Telephone Encounter (Signed)
Called in rx to [facility]. 

## 2015-03-11 ENCOUNTER — Other Ambulatory Visit: Payer: Self-pay | Admitting: Internal Medicine

## 2015-04-07 ENCOUNTER — Other Ambulatory Visit: Payer: Self-pay | Admitting: Internal Medicine

## 2015-04-07 NOTE — Telephone Encounter (Signed)
Rx called to pharmacy

## 2015-04-07 NOTE — Telephone Encounter (Signed)
Approved: #90 x 0 

## 2015-04-07 NOTE — Telephone Encounter (Signed)
CPE 02/02/15.  Last filled #90 02/15/15.

## 2015-05-30 ENCOUNTER — Other Ambulatory Visit: Payer: Self-pay | Admitting: Internal Medicine

## 2015-05-30 NOTE — Telephone Encounter (Signed)
Last f/u 01/2015 

## 2015-05-31 NOTE — Telephone Encounter (Signed)
rx called into pharmacy

## 2015-05-31 NOTE — Telephone Encounter (Signed)
Approved: okay #[phone] x 0 

## 2015-07-26 ENCOUNTER — Other Ambulatory Visit: Payer: Self-pay | Admitting: Internal Medicine

## 2015-07-26 NOTE — Telephone Encounter (Signed)
rx called into pharmacy

## 2015-07-26 NOTE — Telephone Encounter (Signed)
Approved: okay #[phone] x 0 

## 2015-07-26 NOTE — Telephone Encounter (Signed)
05/31/2015 

## 2015-08-01 ENCOUNTER — Other Ambulatory Visit: Payer: Self-pay | Admitting: Internal Medicine

## 2015-08-09 ENCOUNTER — Ambulatory Visit (INDEPENDENT_AMBULATORY_CARE_PROVIDER_SITE_OTHER): Payer: Medicare Other | Admitting: Internal Medicine

## 2015-08-09 ENCOUNTER — Encounter: Payer: Self-pay | Admitting: Internal Medicine

## 2015-08-09 VITALS — BP 120/60 | HR 66 | Temp 98.3°F | Wt 138.0 lb

## 2015-08-09 DIAGNOSIS — I10 Essential (primary) hypertension: Secondary | ICD-10-CM

## 2015-08-09 DIAGNOSIS — R221 Localized swelling, mass and lump, neck: Secondary | ICD-10-CM | POA: Diagnosis not present

## 2015-08-09 DIAGNOSIS — F0150 Vascular dementia without behavioral disturbance: Secondary | ICD-10-CM | POA: Diagnosis not present

## 2015-08-09 DIAGNOSIS — E119 Type 2 diabetes mellitus without complications: Secondary | ICD-10-CM | POA: Diagnosis not present

## 2015-08-09 LAB — HEMOGLOBIN A1C: HEMOGLOBIN A1C: 6.2 % (ref 4.6–6.5)

## 2015-08-09 MED ORDER — TRAMADOL HCL 50 MG PO TABS: 50.0000 mg | ORAL_TABLET | Freq: Three times a day (TID) | ORAL | Status: AC | PRN

## 2015-08-09 NOTE — Assessment & Plan Note (Signed)
BP Readings from Last 3 Encounters:  08/09/15 120/60  02/02/15 120/60  07/28/14 127/63   BP consistently low Will try stopping the diuretic

## 2015-08-09 NOTE — Assessment & Plan Note (Signed)
Marked worsening over the past year Requiring lots of care Daughter will need respite for her upcoming TKR---sister will come in

## 2015-08-09 NOTE — Assessment & Plan Note (Signed)
Hopefully still well controlled 

## 2015-08-09 NOTE — Assessment & Plan Note (Signed)
Right neck mass seems larger--may be thyroid Lack of problems after this long argues against malignancy--and she doesn't want Rx anyway

## 2015-08-09 NOTE — Progress Notes (Signed)
Pre visit review using our clinic review tool, if applicable. No additional management support is needed unless otherwise documented below in the visit note. 

## 2015-08-09 NOTE — Progress Notes (Signed)
   Subjective:    Patient ID: Jeanette [name], female    DOB: 26-Feb-1917, 81 y.o.   MRN: [id number]  HPI Here with daughter for follow up of chronic medical conditions  Having further cognitive decline Needs helps with all ADLs--except she eats independently Some incontinence Uses walker when in home--has rollator now, using it for first time (not working well though) No falls  Still not checking sugars No meds No sores, numbness or pain in feet  No chest pain No SOB No dizziness or syncope  No problems with arthritis pain Using the tramadol bid regularly  Right neck mass not clearly changed No pain  Current Outpatient Prescriptions on File Prior to Visit  Medication Sig Dispense Refill  . amLODipine (NORVASC) 10 MG tablet TAKE 1 TABLET BY MOUTH DAILY 90 tablet 3  . hydrALAZINE (APRESOLINE) 50 MG tablet TAKE 1 TABLET (50 MG TOTAL) BY MOUTH 2 (TWO) TIMES DAILY. 60 tablet 10  . traMADol (ULTRAM) 50 MG tablet TAKE 1 TABLET BY MOUTH 3 TIMES A DAY 90 tablet 0  . triamterene-hydrochlorothiazide (MAXZIDE-25) 37.5-25 MG per tablet TAKE 1 TABLET BY MOUTH DAILY 30 tablet 7   No current facility-administered medications on file prior to visit.    No Known Allergies  Past Medical History  Diagnosis Date  . Diabetes mellitus type II   . HLD (hyperlipidemia)   . HTN (hypertension)   . OA (osteoarthritis)   . DVT (deep venous thrombosis) 11/2004    during hospitalization    Past Surgical History  Procedure Laterality Date  . Liver abscess  12/03/04    strep with strep sepsis  . Esophagogastroduodenoscopy  04/2006    No family history on file.  Social History   Social History  . Marital Status: Widowed    Spouse Name: N/A  . Number of Children: 7  . Years of Education: N/A   Occupational History  . Retired-housekeeper at the hospital    Social History Main Topics  . Smoking status: Never Smoker   . Smokeless tobacco: Never Used  . Alcohol Use: No  . Drug Use: Not  on file  . Sexual Activity: Not on file   Other Topics Concern  . Not on file   Social History Narrative   No living will   Requests daughter Kathie Rhodes, or son Gardiner Rhyme, to make medical decisions for her   Has DNR    Would leave decisions about tube feeds to children   Review of Systems Sleeps well Appetite is good Weight is stable    Objective:   Physical Exam  Constitutional: She appears well-developed and well-nourished. No distress.  Neck: Normal range of motion. Neck supple. No thyromegaly present.  Cardiovascular: Normal rate, regular rhythm, normal heart sounds and intact distal pulses.  Exam reveals no gallop.   No murmur heard. Pulmonary/Chest: Effort normal and breath sounds normal. No respiratory distress. She has no wheezes. She has no rales.  Abdominal: Soft. There is no tenderness.  Musculoskeletal: She exhibits no edema.  Lymphadenopathy:    She has no cervical adenopathy.  Neurological:  Thinks she has never been here before--but remembers me  Skin:  Thickened toenails No foot lesions          Assessment & Plan:

## 2015-08-09 NOTE — Patient Instructions (Addendum)
Please stop the triamterene/HCTZ. Try only giving the tramadol if she has pain.

## 2015-08-10 ENCOUNTER — Encounter: Payer: Self-pay | Admitting: *Deleted

## 2015-08-24 ENCOUNTER — Encounter (HOSPITAL_COMMUNITY): Payer: Self-pay

## 2015-08-24 ENCOUNTER — Observation Stay (HOSPITAL_COMMUNITY)
Admission: EM | Admit: 2015-08-24 | Discharge: 2015-08-27 | Disposition: A | Discharge status: Home / Self Care | Payer: Medicare Other | Attending: Internal Medicine | Admitting: Internal Medicine

## 2015-08-24 ENCOUNTER — Emergency Department (HOSPITAL_COMMUNITY): Payer: Medicare Other

## 2015-08-24 ENCOUNTER — Telehealth: Payer: Self-pay | Admitting: Internal Medicine

## 2015-08-24 DIAGNOSIS — E119 Type 2 diabetes mellitus without complications: Secondary | ICD-10-CM | POA: Diagnosis not present

## 2015-08-24 DIAGNOSIS — R350 Frequency of micturition: Secondary | ICD-10-CM | POA: Diagnosis not present

## 2015-08-24 DIAGNOSIS — I10 Essential (primary) hypertension: Secondary | ICD-10-CM | POA: Diagnosis not present

## 2015-08-24 DIAGNOSIS — R112 Nausea with vomiting, unspecified: Secondary | ICD-10-CM | POA: Diagnosis not present

## 2015-08-24 DIAGNOSIS — E785 Hyperlipidemia, unspecified: Secondary | ICD-10-CM | POA: Diagnosis not present

## 2015-08-24 DIAGNOSIS — N390 Urinary tract infection, site not specified: Secondary | ICD-10-CM | POA: Insufficient documentation

## 2015-08-24 DIAGNOSIS — Z86718 Personal history of other venous thrombosis and embolism: Secondary | ICD-10-CM | POA: Diagnosis not present

## 2015-08-24 DIAGNOSIS — F0150 Vascular dementia without behavioral disturbance: Secondary | ICD-10-CM | POA: Diagnosis not present

## 2015-08-24 DIAGNOSIS — R221 Localized swelling, mass and lump, neck: Secondary | ICD-10-CM | POA: Diagnosis not present

## 2015-08-24 DIAGNOSIS — Z66 Do not resuscitate: Secondary | ICD-10-CM | POA: Diagnosis not present

## 2015-08-24 DIAGNOSIS — E872 Acidosis: Secondary | ICD-10-CM | POA: Insufficient documentation

## 2015-08-24 DIAGNOSIS — E876 Hypokalemia: Secondary | ICD-10-CM | POA: Diagnosis not present

## 2015-08-24 DIAGNOSIS — K5641 Fecal impaction: Principal | ICD-10-CM | POA: Insufficient documentation

## 2015-08-24 DIAGNOSIS — K529 Noninfective gastroenteritis and colitis, unspecified: Secondary | ICD-10-CM | POA: Insufficient documentation

## 2015-08-24 DIAGNOSIS — R9431 Abnormal electrocardiogram [ECG] [EKG]: Secondary | ICD-10-CM | POA: Diagnosis not present

## 2015-08-24 DIAGNOSIS — K5900 Constipation, unspecified: Secondary | ICD-10-CM | POA: Diagnosis not present

## 2015-08-24 HISTORY — DX: Localized swelling, mass and lump, neck: R22.1

## 2015-08-24 HISTORY — DX: Unspecified dementia, unspecified severity, without behavioral disturbance, psychotic disturbance, mood disturbance, and anxiety: F03.90

## 2015-08-24 HISTORY — DX: Personal history of other specified conditions: Z87.898

## 2015-08-24 LAB — COMPREHENSIVE METABOLIC PANEL
ALBUMIN: 3.9 g/dL (ref 3.5–5.0)
ALK PHOS: 56 U/L (ref 38–126)
ALT: 17 U/L (ref 14–54)
AST: 46 U/L — ABNORMAL HIGH (ref 15–41)
Anion gap: 9 (ref 5–15)
BUN: 18 mg/dL (ref 6–20)
CALCIUM: 8.9 mg/dL (ref 8.9–10.3)
CO2: 24 mmol/L (ref 22–32)
CREATININE: 0.83 mg/dL (ref 0.44–1.00)
Chloride: 108 mmol/L (ref 101–111)
GFR calc Af Amer: >60 mL/min (ref >60)
GFR calc non Af Amer: 58 mL/min — ABNORMAL LOW (ref >60)
GLUCOSE: 157 mg/dL — AB (ref 65–99)
Potassium: 4.8 mmol/L (ref 3.5–5.1)
SODIUM: 141 mmol/L (ref 135–145)
Total Bilirubin: 1.1 mg/dL (ref 0.3–1.2)
Total Protein: 7.2 g/dL (ref 6.5–8.1)

## 2015-08-24 LAB — CBC WITH DIFFERENTIAL/PLATELET
BASOS ABS: 0.1 10*3/uL (ref 0.0–0.1)
Basophils Relative: 2 %
Eosinophils Absolute: 0 10*3/uL (ref 0.0–0.7)
Eosinophils Relative: 0 %
HEMATOCRIT: 39.2 % (ref 36.0–46.0)
Hemoglobin: 13.1 g/dL (ref 12.0–15.0)
Lymphocytes Relative: 10 %
Lymphs Abs: 0.8 10*3/uL (ref 0.7–4.0)
MCH: 31.6 pg (ref 26.0–34.0)
MCHC: 33.4 g/dL (ref 30.0–36.0)
MCV: 94.7 fL (ref 78.0–100.0)
MONO ABS: 0.5 10*3/uL (ref 0.1–1.0)
MONOS PCT: 6 %
NEUTROS ABS: 6.5 10*3/uL (ref 1.7–7.7)
Neutrophils Relative %: 82 %
Platelets: 163 10*3/uL (ref 150–400)
RBC: 4.14 MIL/uL (ref 3.87–5.11)
RDW: 17.4 % — AB (ref 11.5–15.5)
WBC: 7.9 10*3/uL (ref 4.0–10.5)

## 2015-08-24 LAB — URINALYSIS, ROUTINE W REFLEX MICROSCOPIC
Bilirubin Urine: NEGATIVE
Glucose, UA: NEGATIVE mg/dL
Hgb urine dipstick: NEGATIVE
Ketones, ur: 15 mg/dL — AB
NITRITE: NEGATIVE
Protein, ur: 30 mg/dL — AB
SPECIFIC GRAVITY, URINE: 1.016 (ref 1.005–1.030)
UROBILINOGEN UA: 1 mg/dL (ref 0.0–1.0)
pH: 7 (ref 5.0–8.0)

## 2015-08-24 LAB — LIPASE, BLOOD: Lipase: 25 U/L (ref 22–51)

## 2015-08-24 LAB — URINE MICROSCOPIC-ADD ON

## 2015-08-24 LAB — TROPONIN I

## 2015-08-24 LAB — LACTIC ACID, PLASMA: Lactic Acid, Venous: 2.7 mmol/L (ref 0.5–2.0)

## 2015-08-24 MED ORDER — POLYETHYLENE GLYCOL 3350 17 G PO PACK
17.0000 g | PACK | Freq: Every day | ORAL | Status: DC
  Administered 2015-08-24 – 2015-08-27 (×4): 17 g via ORAL
  Filled 2015-08-24 (×5): qty 1

## 2015-08-24 MED ORDER — ONDANSETRON HCL 4 MG/2ML IJ SOLN
4.0000 mg | INTRAMUSCULAR | Status: DC | PRN
  Administered 2015-08-24: 4 mg via INTRAVENOUS
  Filled 2015-08-24: qty 2

## 2015-08-24 MED ORDER — ENOXAPARIN SODIUM 40 MG/0.4ML ~~LOC~~ SOLN
40.0000 mg | SUBCUTANEOUS | Status: DC
  Administered 2015-08-24 – 2015-08-26 (×3): 40 mg via SUBCUTANEOUS
  Filled 2015-08-24 (×4): qty 0.4

## 2015-08-24 MED ORDER — TRAMADOL HCL 50 MG PO TABS: 50.0000 mg | ORAL_TABLET | Freq: Three times a day (TID) | ORAL | Status: DC | PRN

## 2015-08-24 MED ORDER — BISACODYL 10 MG RE SUPP
10.0000 mg | Freq: Once | RECTAL | Status: AC
  Administered 2015-08-24: 10 mg via RECTAL
  Filled 2015-08-24: qty 1

## 2015-08-24 MED ORDER — SENNA 8.6 MG PO TABS
1.0000 | ORAL_TABLET | Freq: Two times a day (BID) | ORAL | Status: DC
  Administered 2015-08-24 – 2015-08-27 (×5): 8.6 mg via ORAL
  Filled 2015-08-24 (×5): qty 1

## 2015-08-24 MED ORDER — DEXTROSE 5 % IV SOLN
1.0000 g | INTRAVENOUS | Status: DC
  Administered 2015-08-24 – 2015-08-26 (×3): 1 g via INTRAVENOUS
  Filled 2015-08-24 (×3): qty 10

## 2015-08-24 MED ORDER — ONDANSETRON HCL 4 MG/2ML IJ SOLN: 4.0000 mg | Freq: Four times a day (QID) | INTRAMUSCULAR | Status: DC | PRN

## 2015-08-24 MED ORDER — ACETAMINOPHEN 325 MG PO TABS
650.0000 mg | ORAL_TABLET | Freq: Four times a day (QID) | ORAL | Status: DC | PRN
  Administered 2015-08-27: 650 mg via ORAL
  Filled 2015-08-24: qty 2

## 2015-08-24 MED ORDER — ENSURE ENLIVE PO LIQD
237.0000 mL | Freq: Two times a day (BID) | ORAL | Status: DC
  Administered 2015-08-25 – 2015-08-27 (×6): 237 mL via ORAL

## 2015-08-24 MED ORDER — ACETAMINOPHEN 650 MG RE SUPP: 650.0000 mg | Freq: Four times a day (QID) | RECTAL | Status: DC | PRN

## 2015-08-24 MED ORDER — DEXTROSE 5 % IV SOLN
1.0000 g | Freq: Once | INTRAVENOUS | Status: DC
  Filled 2015-08-24: qty 10

## 2015-08-24 MED ORDER — ONDANSETRON HCL 4 MG PO TABS: 4.0000 mg | ORAL_TABLET | Freq: Four times a day (QID) | ORAL | Status: DC | PRN

## 2015-08-24 MED ORDER — SODIUM CHLORIDE 0.9 % IV BOLUS (SEPSIS): 500.0000 mL | Freq: Once | INTRAVENOUS | Status: DC

## 2015-08-24 MED ORDER — SODIUM CHLORIDE 0.9 % IV SOLN
INTRAVENOUS | Status: DC
  Administered 2015-08-24 – 2015-08-26 (×3): via INTRAVENOUS

## 2015-08-24 MED ORDER — HYDRALAZINE HCL 50 MG PO TABS
50.0000 mg | ORAL_TABLET | Freq: Three times a day (TID) | ORAL | Status: DC
  Administered 2015-08-24 – 2015-08-27 (×8): 50 mg via ORAL
  Filled 2015-08-24 (×11): qty 1

## 2015-08-24 NOTE — ED Notes (Signed)
Pt's daughter reports that the Pt has vomited several times today.  Sts she can keep down a small amount of solids, but vomits liquids.  Denies complaints of pain and diarrhea.  EDP made aware.

## 2015-08-24 NOTE — ED Notes (Addendum)
Per EMS, Pt, from home, c/o emesis x 1 this morning.  Denies pain.  Denies diarrhea.  Pt reports that she vomited after breakfast, but was able to tolerate lunch.  EMS sts "someone visited her that had recently been to the hospital and she's afraid that they gave her a bug."

## 2015-08-24 NOTE — ED Notes (Signed)
Will do ekg when pt returns from x-ray. 

## 2015-08-24 NOTE — ED Notes (Signed)
I have just performed digital rectal exam and found no stool in the rectal vault; which I reported to Dr. Clarene DukeMcManus.  I had also relayed to Dr. Clarene DukeMcManus pt's. Lactic acid level. Pt. Remains in no distress and is quite pleasant in her demeanor.  Two daughters remain in attendance.

## 2015-08-24 NOTE — Telephone Encounter (Signed)
[facility] per encounter note.

## 2015-08-24 NOTE — ED Notes (Signed)
Patient transported to X-ray 

## 2015-08-24 NOTE — Progress Notes (Signed)
ANTIBIOTIC CONSULT NOTE - INITIAL  Pharmacy Consult for Ceftriaxone Indication: UTI  No Known Allergies  Patient Measurements:   Adjusted Body Weight:   Vital Signs: Temp: 98.4 F (36.9 C) (10/12 1605) Temp Source: Oral (10/12 1605) BP: 142/62 mmHg (10/12 1952) Pulse Rate: 88 (10/12 1952) Intake/Output from previous day:   Intake/Output from this shift:    Labs:  Recent Labs  08/24/15 1720  WBC 7.9  HGB 13.1  PLT 163  CREATININE 0.83   CrCl cannot be calculated (Unknown ideal weight.). No results for input(s): VANCOTROUGH, VANCOPEAK, VANCORANDOM, GENTTROUGH, GENTPEAK, GENTRANDOM, TOBRATROUGH, TOBRAPEAK, TOBRARND, AMIKACINPEAK, AMIKACINTROU, AMIKACIN in the last 72 hours.   Microbiology: No results found for this or any previous visit (from the past 720 hour(s)).  Medical History: Past Medical History  Diagnosis Date  . Diabetes mellitus type II   . HLD (hyperlipidemia)   . HTN (hypertension)   . OA (osteoarthritis)   . DVT (deep venous thrombosis) (HCC) 11/2004    during hospitalization  . Dementia   . Mass of right side of neck   . History of unsteady gait     walks with walker at home   Assessment: [age] yoF presents with nausea and vomiting, found to have mildly elevated lactic acid.  U/A+ bacteria, leukocytes, and WBC.  Pharmacy consulted to start ceftriaxone for UTI.  No allergies noted.    Goal of Therapy:  Eradication of infection  Plan:  Ceftriaxone 1g IV q24h  Need for further dosage adjustment appears unlikely at present.    Will sign off at this time.  Please reconsult if a change in clinical status warrants re-evaluation of dosage.  Haynes Hoehnolleen Felicity Penix, PharmD, BCPS 08/24/2015, 9:41 PM  Pager: [phone]

## 2015-08-24 NOTE — ED Notes (Signed)
RN attempted to collect blood with IV start however unable. I attempted to draw blood however unsuccessful.

## 2015-08-24 NOTE — ED Notes (Signed)
Bed: [id number] Expected date:  Expected time:  Means of arrival:  Comments: EMS- 81yo, emesis/no pain

## 2015-08-24 NOTE — ED Provider Notes (Signed)
CSN: [id number]     Arrival date & time 08/24/15  1557 History   First MD Initiated Contact with Patient 08/24/15 1605     Chief Complaint  Patient presents with  . Emesis      Patient is a 81 y.o. female presenting with vomiting. The history is provided by the patient and a relative. The history is limited by the condition of the patient (Hx dementia).  Emesis Pt was seen at 1315. Per pt and her family: Pt with multiple intermittent episodes of N/V that began this morning. Pt has been unable to tol fluids due to her symptoms. Denies any other complaints. Denies diarrhea, no CP/SOB, no back pain, no abd pain, no fevers, no black or blood in emesis.     Past Medical History  Diagnosis Date  . Diabetes mellitus type II   . HLD (hyperlipidemia)   . HTN (hypertension)   . OA (osteoarthritis)   . DVT (deep venous thrombosis) (HCC) 11/2004    during hospitalization  . Dementia   . Mass of right side of neck   . History of unsteady gait     walks with walker at home   Past Surgical History  Procedure Laterality Date  . Liver abscess  12/03/04    strep with strep sepsis  . Esophagogastroduodenoscopy  04/2006    Social History  Substance Use Topics  . Smoking status: Never Smoker   . Smokeless tobacco: Never Used  . Alcohol Use: No    Review of Systems  Unable to perform ROS: Dementia  Gastrointestinal: Positive for vomiting.     Allergies  Review of patient's allergies indicates no known allergies.  Home Medications   Prior to Admission medications   Medication Sig Start Date End Date Taking? Authorizing Provider  amLODipine (NORVASC) 10 MG tablet TAKE 1 TABLET BY MOUTH DAILY 03/11/15  Yes Karie Schwalbeichard I Letvak, MD  hydrALAZINE (APRESOLINE) 50 MG tablet TAKE 1 TABLET (50 MG TOTAL) BY MOUTH 2 (TWO) TIMES DAILY. 08/01/15  Yes Karie Schwalbeichard I Letvak, MD  traMADol (ULTRAM) 50 MG tablet Take 1 tablet (50 mg total) by mouth 3 (three) times daily as needed. 08/09/15  Yes Karie Schwalbeichard I Letvak,  MD  triamterene-hydrochlorothiazide (MAXZIDE-25) 37.5-25 MG tablet Take 1 tablet by mouth daily. 08/23/15  Yes Historical Provider, MD   BP 129/72 mmHg  Pulse 85  Temp(Src) 98.4 F (36.9 C) (Oral)  Resp 16  SpO2 96%   18:01 Orthostatic Vital Signs RB  Orthostatic Lying  - BP- Lying: 155/61 mmHg ; Pulse- Lying: 72  Orthostatic Sitting - BP- Sitting: 145/76 mmHg ; Pulse- Sitting: 89      Physical Exam  1620: Physical examination:  Nursing notes reviewed; Vital signs and O2 SAT reviewed;  Constitutional: Well developed, Well nourished, Well hydrated, In no acute distress; Head:  Normocephalic, atraumatic; Eyes: EOMI, PERRL, No scleral icterus; ENMT: Mouth and pharynx normal, Mucous membranes moist; Neck: Supple, Full range of motion, No lymphadenopathy; Cardiovascular: Regular rate and rhythm, No gallop; Respiratory: Breath sounds clear & equal bilaterally, No wheezes.  Speaking full sentences with ease, Normal respiratory effort/excursion; Chest: Nontender, Movement normal; Abdomen: Soft, Nontender, Nondistended, Normal bowel sounds; Genitourinary: No CVA tenderness; Extremities: Pulses normal, No tenderness, No edema, No calf edema or asymmetry.; Neuro: Awake, alert, confused re: time, events per hx dementia. Major CN grossly intact. No facial droop. Speech clear. Moves all extremities spontaneously without apparent gross focal motor deficits.; Skin: Color normal, Warm, Dry.    ED Course  Procedures (including critical care time) Labs Review   Imaging Review  I have personally reviewed and evaluated these images and lab results as part of my medical decision-making.   EKG Interpretation   Date/Time:  Wednesday August 24 2015 17:03:07 EDT Ventricular Rate:  80 PR Interval:  213 QRS Duration: 68 QT Interval:  388 QTC Calculation: 448 R Axis:   -15 Text Interpretation:  Sinus rhythm Borderline left axis deviation Anterior  infarct, old Nonspecific T abnormalities, lateral leads  Baseline wander  When compared with ECG of 12/04/2004 Nonspecific T wave abnormality Lateral  leads is now Present Confirmed by [facility]  MD, Nicholos Johns [phone]) on  08/24/2015 5:11:33 PM      MDM  MDM Reviewed: previous chart, nursing note and vitals Reviewed previous: labs and ECG Interpretation: labs, ECG and x-ray      Results for orders placed or performed during the hospital encounter of 08/24/15  Urinalysis, Routine w reflex microscopic  Result Value Ref Range   Color, Urine YELLOW YELLOW   APPearance CLOUDY (A) CLEAR   Specific Gravity, Urine 1.016 1.005 - 1.030   pH 7.0 5.0 - 8.0   Glucose, UA NEGATIVE NEGATIVE mg/dL   Hgb urine dipstick NEGATIVE NEGATIVE   Bilirubin Urine NEGATIVE NEGATIVE   Ketones, ur 15 (A) NEGATIVE mg/dL   Protein, ur 30 (A) NEGATIVE mg/dL   Urobilinogen, UA 1.0 0.0 - 1.0 mg/dL   Nitrite NEGATIVE NEGATIVE   Leukocytes, UA MODERATE (A) NEGATIVE  Comprehensive metabolic panel  Result Value Ref Range   Sodium 141 135 - 145 mmol/L   Potassium 4.8 3.5 - 5.1 mmol/L   Chloride 108 101 - 111 mmol/L   CO2 24 22 - 32 mmol/L   Glucose, Bld 157 (H) 65 - 99 mg/dL   BUN 18 6 - 20 mg/dL   Creatinine, Ser 1.91 0.44 - 1.00 mg/dL   Calcium 8.9 8.9 - 47.8 mg/dL   Total Protein 7.2 6.5 - 8.1 g/dL   Albumin 3.9 3.5 - 5.0 g/dL   AST 46 (H) 15 - 41 U/L   ALT 17 14 - 54 U/L   Alkaline Phosphatase 56 38 - 126 U/L   Total Bilirubin 1.1 0.3 - 1.2 mg/dL   GFR calc non Af Amer 58 (L) >60 mL/min   GFR calc Af Amer >60 >60 mL/min   Anion gap 9 5 - 15  Lipase, blood  Result Value Ref Range   Lipase 25 22 - 51 U/L  Lactic acid, plasma  Result Value Ref Range   Lactic Acid, Venous 2.7 (HH) 0.5 - 2.0 mmol/L  Troponin I  Result Value Ref Range   Troponin I <0.03 <0.031 ng/mL  CBC with Differential  Result Value Ref Range   WBC 7.9 4.0 - 10.5 K/uL   RBC 4.14 3.87 - 5.11 MIL/uL   Hemoglobin 13.1 12.0 - 15.0 g/dL   HCT 29.5 62.1 - 30.8 %   MCV 94.7 78.0 - 100.0 fL    MCH 31.6 26.0 - 34.0 pg   MCHC 33.4 30.0 - 36.0 g/dL   RDW 65.7 (H) 84.6 - 96.2 %   Platelets 163 150 - 400 K/uL   Neutrophils Relative % 82 %   Neutro Abs 6.5 1.7 - 7.7 K/uL   Lymphocytes Relative 10 %   Lymphs Abs 0.8 0.7 - 4.0 K/uL   Monocytes Relative 6 %   Monocytes Absolute 0.5 0.1 - 1.0 K/uL   Eosinophils Relative 0 %   Eosinophils Absolute  0.0 0.0 - 0.7 K/uL   Basophils Relative 2 %   Basophils Absolute 0.1 0.0 - 0.1 K/uL  Urine microscopic-add on  Result Value Ref Range   Squamous Epithelial / LPF FEW (A) RARE   WBC, UA 21-50 <3 WBC/hpf   RBC / HPF 0-2 <3 RBC/hpf   Bacteria, UA FEW (A) RARE   Dg Abd Acute W/chest 08/24/2015  CLINICAL DATA:  Nausea and vomiting. EXAM: DG ABDOMEN ACUTE W/ 1V CHEST COMPARISON:  Chest x-ray dated 12/12/2004 and CT scan abdomen dated 12/18/2004 FINDINGS: There is a large right peritracheal mass deviating the trachea to the left. This is new since 2006. This may represent enlargement of the right lobe of the thyroid gland. There is cardiomegaly with tortuosity and calcification of the thoracic aorta. Pulmonary vascularity is normal. Slight accentuation of the interstitial markings at the lung bases. Chronic rotator cuff disease at both shoulders. Diffuse osteophytes throughout the spine. There are no dilated loops of large or small bowel. No free air. Moderate stool in the rectum consistent with a fecal impaction. Multiple phleboliths in the abdomen and pelvis. Extensive arterial vascular calcification. IMPRESSION: 1. Fecal impaction in the rectum. Otherwise benign appearing abdomen. 2. Large right peritracheal mass, new since 2006. This could represent a mass in the right lobe of the thyroid gland. Electronically Signed   By: Francene Boyers M.D.   On: 08/24/2015 17:10     1930:  Could not stand for orthostatic VS. No stool in rectal vault on DRE. +UTI, UC pending; will dose IV rocephin. Judicious IVF given for mildly elevated lactic acid. EKG with  new TWA compared to previous dated 2006. Troponin normal. Pt denies CP, but also denies rectal pain despite fecal impaction seen on AXR.  Dx and testing d/w pt and family.  Questions answered.  Verb understanding, agreeable to observation admit.  T/C to [facility] Dr. Allena Katz, case discussed, including:  HPI, pertinent PM/SHx, VS/PE, dx testing, ED course and treatment:  Agreeable to come to ED for evaluation, requests to d/c IV rocephin and start PO fluids.    Samuel Jester, DO 08/27/15 1537

## 2015-08-24 NOTE — Telephone Encounter (Signed)
[facility] Day - Client TELEPHONE ADVICE RECORD [facility] Patient Name: Jeanette MohairHATTIE [name] DOB: [birth year] Initial Comment Caller states her mother has been vomiting all morning. Nurse Assessment Nurse: Roderic OvensNorth, RN, Amy Date/Time Lamount Cohen(Eastern Time): 08/24/2015 2:42:45 PM Confirm and document reason for call. If symptomatic, describe symptoms. ---DAUGHTER STATES THAT SHE HAS BEEN VOMITING ALL MORNING. ABOUT 10-11 VOMITING. SHE ATE BOILED EGG AND A SPOON OF OATMEAL. SHE IS VOMITING UP LIQUIDS ONLY. SHE IS HAVING URINE OUTPUT. SHE DOES NOT WANT HER DAUGHTER TO CALL THE DOCTOR. Has the patient traveled out of the country within the last 30 days? ---Not Applicable Does the patient have any new or worsening symptoms? ---Yes Will a triage be completed? ---Yes Related visit to physician within the last 2 weeks? ---No Does the PT have any chronic conditions? (i.e. diabetes, asthma, etc.) ---No Guidelines Guideline Title Affirmed Question Affirmed Notes Vomiting [1] MODERATE vomiting (e.g., 3 - 5 times/ day) AND [2] age > 5960 Final Disposition User Go to ED Now (or PCP triage) Roderic OvensNorth, RN, Amy [facility] Disagree/Comply: Comply

## 2015-08-24 NOTE — ED Notes (Signed)
Pt given ginger ale.

## 2015-08-24 NOTE — ED Notes (Signed)
Main lab called for assistance with blood draw. Spoke to [employer]enee

## 2015-08-25 DIAGNOSIS — K5641 Fecal impaction: Secondary | ICD-10-CM | POA: Diagnosis not present

## 2015-08-25 DIAGNOSIS — E119 Type 2 diabetes mellitus without complications: Secondary | ICD-10-CM | POA: Diagnosis not present

## 2015-08-25 DIAGNOSIS — K5900 Constipation, unspecified: Secondary | ICD-10-CM

## 2015-08-25 DIAGNOSIS — N390 Urinary tract infection, site not specified: Secondary | ICD-10-CM | POA: Diagnosis present

## 2015-08-25 LAB — COMPREHENSIVE METABOLIC PANEL
ALBUMIN: 3.3 g/dL — AB (ref 3.5–5.0)
ALK PHOS: 49 U/L (ref 38–126)
ALT: 14 U/L (ref 14–54)
ANION GAP: 6 (ref 5–15)
AST: 21 U/L (ref 15–41)
BILIRUBIN TOTAL: 0.8 mg/dL (ref 0.3–1.2)
BUN: 12 mg/dL (ref 6–20)
CALCIUM: 8.4 mg/dL — AB (ref 8.9–10.3)
CO2: 28 mmol/L (ref 22–32)
Chloride: 109 mmol/L (ref 101–111)
Creatinine, Ser: 0.73 mg/dL (ref 0.44–1.00)
GFR calc Af Amer: >60 mL/min (ref >60)
GLUCOSE: 96 mg/dL (ref 65–99)
POTASSIUM: 3.4 mmol/L — AB (ref 3.5–5.1)
Sodium: 143 mmol/L (ref 135–145)
TOTAL PROTEIN: 6.1 g/dL — AB (ref 6.5–8.1)

## 2015-08-25 LAB — CBC
HEMATOCRIT: 35.5 % — AB (ref 36.0–46.0)
HEMOGLOBIN: 11.7 g/dL — AB (ref 12.0–15.0)
MCH: 31.9 pg (ref 26.0–34.0)
MCHC: 33 g/dL (ref 30.0–36.0)
MCV: 96.7 fL (ref 78.0–100.0)
Platelets: 153 10*3/uL (ref 150–400)
RBC: 3.67 MIL/uL — ABNORMAL LOW (ref 3.87–5.11)
RDW: 17.7 % — AB (ref 11.5–15.5)
WBC: 8 10*3/uL (ref 4.0–10.5)

## 2015-08-25 LAB — LACTIC ACID, PLASMA: LACTIC ACID, VENOUS: 3.3 mmol/L — AB (ref 0.5–2.0)

## 2015-08-25 MED ORDER — POTASSIUM CHLORIDE CRYS ER 20 MEQ PO TBCR
40.0000 meq | EXTENDED_RELEASE_TABLET | Freq: Once | ORAL | Status: AC
  Administered 2015-08-25: 40 meq via ORAL
  Filled 2015-08-25: qty 2

## 2015-08-25 NOTE — Telephone Encounter (Signed)
She was admitted I will check on her in the hospital 

## 2015-08-25 NOTE — H&P (Signed)
[facility] History and Physical  Patient: Jeanette [name]  MRN: [id number]  DOB: 1916/12/07  DOS: the patient was seen and examined on 08/24/2015 PCP: Tillman Abideichard Letvak, MD  Referring physician: Dr. Clarene DukeMcManus Chief Complaint: Vomiting  HPI: Jeanette DroneHattie J Eimers is a 81 y.o. female with Past medical history of dementia, diabetes mellitus, hypertension, neck mass. The patient is presenting with complaints of recurrent episodes of vomiting. The patient lives with one of her daughter but currently is living with another daughter since last one week. At her baseline the patient is fairly active and eats appropriately. Since last 2 days the patient has not been having adequate appetite. She has been fatigue and tired. They've also noted increased frequency of urination. Today the patient started having episodes of vomiting family mentions 4-5 episodes of nonbilious nonbloody vomiting. The patient did not have any complaints of abdominal pain chest pain nausea at the time of my evaluation. No fever no chills no diarrhea. Family is unaware of any history regarding bowel movements. No fall no trauma reported.  The patient is coming from home.  At her baseline ambulates with support And is independent for most of her ADL; manages her medication on her own.  Review of Systems: as mentioned in the history of present illness.  A comprehensive review of the other systems is negative.  Past Medical History  Diagnosis Date  . Diabetes mellitus type II   . HLD (hyperlipidemia)   . HTN (hypertension)   . OA (osteoarthritis)   . DVT (deep venous thrombosis) (HCC) 11/2004    during hospitalization  . Dementia   . Mass of right side of neck   . History of unsteady gait     walks with walker at home   Past Surgical History  Procedure Laterality Date  . Liver abscess  12/03/04    strep with strep sepsis  . Esophagogastroduodenoscopy  04/2006   Social History:  reports that she has never smoked.  She has never used smokeless tobacco. She reports that she does not drink alcohol or use illicit drugs.  No Known Allergies  History reviewed. No pertinent family history.  Prior to Admission medications   Medication Sig Start Date End Date Taking? Authorizing Provider  amLODipine (NORVASC) 10 MG tablet TAKE 1 TABLET BY MOUTH DAILY 03/11/15  Yes Karie Schwalbeichard I Letvak, MD  hydrALAZINE (APRESOLINE) 50 MG tablet TAKE 1 TABLET (50 MG TOTAL) BY MOUTH 2 (TWO) TIMES DAILY. 08/01/15  Yes Karie Schwalbeichard I Letvak, MD  traMADol (ULTRAM) 50 MG tablet Take 1 tablet (50 mg total) by mouth 3 (three) times daily as needed. 08/09/15  Yes Karie Schwalbeichard I Letvak, MD  triamterene-hydrochlorothiazide (MAXZIDE-25) 37.5-25 MG tablet Take 1 tablet by mouth daily. 08/23/15   Historical Provider, MD    Physical Exam: Filed Vitals:   08/24/15 1605 08/24/15 1813 08/24/15 1952 08/24/15 2130  BP: 129/72 145/76 142/62 155/97  Pulse: 85 78 88 85  Temp: 98.4 F (36.9 C)   98.9 F (37.2 C)  TempSrc: Oral   Oral  Resp: 16 16 18 18   SpO2: 96% 98% 97% 98%    General: Alert, Awake and Oriented to Time, Place and Person. Appear in mild distress Eyes: PERRL ENT: Oral Mucosa clear moist. Neck: no JVD Cardiovascular: S1 and S2 Present, no Murmur, Peripheral Pulses Present Respiratory: Bilateral Air entry equal and Decreased,  Clear to Auscultation, no Crackles, no wheezes Abdomen: Bowel Sound present, Soft and no tenderness Skin: no Rash Extremities: no Pedal edema, no  calf tenderness Neurologic: Grossly no focal neuro deficit.  Labs on Admission:  CBC:  Recent Labs Lab 08/24/15 1720  WBC 7.9  NEUTROABS 6.5  HGB 13.1  HCT 39.2  MCV 94.7  PLT 163    CMP     Component Value Date/Time   NA 141 08/24/2015 1720   K 4.8 08/24/2015 1720   CL 108 08/24/2015 1720   CO2 24 08/24/2015 1720   GLUCOSE 157* 08/24/2015 1720   BUN 18 08/24/2015 1720   CREATININE 0.83 08/24/2015 1720   CALCIUM 8.9 08/24/2015 1720   PROT 7.2  08/24/2015 1720   ALBUMIN 3.9 08/24/2015 1720   AST 46* 08/24/2015 1720   ALT 17 08/24/2015 1720   ALKPHOS 56 08/24/2015 1720   BILITOT 1.1 08/24/2015 1720   GFRNONAA 58* 08/24/2015 1720   GFRAA >60 08/24/2015 1720     Recent Labs Lab 08/24/15 1720  TROPONINI <0.03   BNP (last 3 results) No results for input(s): BNP in the last 8760 hours.  ProBNP (last 3 results) No results for input(s): PROBNP in the last 8760 hours.   Radiological Exams on Admission: Dg Abd Acute W/chest  08/24/2015  CLINICAL DATA:  Nausea and vomiting. EXAM: DG ABDOMEN ACUTE W/ 1V CHEST COMPARISON:  Chest x-ray dated 12/12/2004 and CT scan abdomen dated 12/18/2004 FINDINGS: There is a large right peritracheal mass deviating the trachea to the left. This is new since 2006. This may represent enlargement of the right lobe of the thyroid gland. There is cardiomegaly with tortuosity and calcification of the thoracic aorta. Pulmonary vascularity is normal. Slight accentuation of the interstitial markings at the lung bases. Chronic rotator cuff disease at both shoulders. Diffuse osteophytes throughout the spine. There are no dilated loops of large or small bowel. No free air. Moderate stool in the rectum consistent with a fecal impaction. Multiple phleboliths in the abdomen and pelvis. Extensive arterial vascular calcification. IMPRESSION: 1. Fecal impaction in the rectum. Otherwise benign appearing abdomen. 2. Large right peritracheal mass, new since 2006. This could represent a mass in the right lobe of the thyroid gland. Electronically Signed   By: Francene Boyers M.D.   On: 08/24/2015 17:10   Assessment/Plan 1. Constipation Patient presents with complaint of nausea and vomiting. She is found to be having fecal impaction. The ED physician and is unable to disimpact the patient in the ER. With this the patient will be admitted in the hospital. Currently I would hold amlodipine since it can cause constipation on  long-term. Next and also hold on tramadol. We will give her IV hydration, use MiraLAX as well as Senokot. We'll monitor closely.  2.  Lactic acidosis with dehydration. Nausea and vomiting. Gastroenteritis. The patient is found to be having nausea and vomiting with lactic acidosis. This is most likely viral gastroenteritis. We will hydrate her with IV fluids and recheck lactic acid. X-ray does not show any evidence of small bowel obstruction.  3  Diabetes mellitus type 2, controlled, without complications (HCC) Diet controlled. Continue close monitoring.  4  Vascular dementia, uncomplicated Pleasantly confused and no evidence of vegetation continue close monitoring.  5  Essential hypertension, benign Patient will benefit from removing animal living from her regimen since it can cause constipation. Visit hydralazine 3 times a day.  6  Neck mass As per PCPs notes no further workup indicated at present since the patient has had not shown any interest in treatment.  7  UTI (urinary tract infection) Possible UTI will treat  her with IV ceftriaxone.  Nutrition: Advance diet as tolerated DVT Prophylaxis: subcutaneous Heparin  Advance goals of care discussion: DNR/DNI as per my discussion with patient.   Family Communication: family was present at bedside, opportunity was given to ask question and all questions were answered satisfactorily at the time of interview. Disposition: Admitted as observation, med-surge unit.  Author: Lynden Oxford, MD [facility] Pager: [phone] 08/24/2015 10:02 PM  If 7PM-7AM, please contact night-coverage [email or web address] Password [email or web address]

## 2015-08-25 NOTE — Progress Notes (Signed)
[facility] PROGRESS NOTE  Jeanette [name] Cayson ZOX:[id number] DOB: 02-25-1917 DOA: 08/24/2015 PCP: Tillman Abide, MD  Assessment/Plan: 1. Nausea, vomiting:  Unclear etiology, gastroenteritis vs fecal impaction.  One BM today.  Symptoms have resolved.  Resume IV hydration and stool softeners.    Lactic acidosis: Improving.  Recommend checking another lactate level today.  Continue with fluids.    Diabetes mellitus.  CBG (last 3)  No results for input(s): GLUCAP in the last 72 hours.  Diet controlled.    Vascular dementia: No agitation. Stable.    Hypokalemia: Replete as needed. And repeat in am.    UTI: Cultures are pending and is on rocephin.   Hypertension: Well controlled.    Code Status: DNR Family Communication: discussed with daughter on 10/13 at bedside  Disposition Plan: possibly home in am if she improves   Consultants:  none  Procedures:  none  Antibiotics:  Rocephin.   HPI/Subjective: Cheerful, denies any nausea or vomiting or diarrhea.  Wants to know when she can go home.   Objective: Filed Vitals:   08/25/15 1452  BP: 106/60  Pulse: 72  Temp: 97.7 F (36.5 C)  Resp:    No intake or output data in the 24 hours ending 08/25/15 1610 There were no vitals filed for this visit.  Exam:   General:  Alert afebrile comfortable  Cardiovascular: s1s2  Respiratory: ctab  Abdomen: soft NT ND BS+  Musculoskeletal: no pedal  Edema, cyanosis or clubbing.   Data Reviewed: Basic Metabolic Panel:  Recent Labs Lab 08/24/15 1720 08/25/15 0549  NA 141 143  K 4.8 3.4*  CL 108 109  CO2 24 28  GLUCOSE 157* 96  BUN 18 12  CREATININE 0.83 0.73  CALCIUM 8.9 8.4*   Liver Function Tests:  Recent Labs Lab 08/24/15 1720 08/25/15 0549  AST 46* 21  ALT 17 14  ALKPHOS 56 49  BILITOT 1.1 0.8  PROT 7.2 6.1*  ALBUMIN 3.9 3.3*    Recent Labs Lab 08/24/15 1720  LIPASE 25   No results for input(s): AMMONIA in the last 168  hours. CBC:  Recent Labs Lab 08/24/15 1720 08/25/15 0549  WBC 7.9 8.0  NEUTROABS 6.5  --   HGB 13.1 11.7*  HCT 39.2 35.5*  MCV 94.7 96.7  PLT 163 153   Cardiac Enzymes:  Recent Labs Lab 08/24/15 1720  TROPONINI <0.03   BNP (last 3 results) No results for input(s): BNP in the last 8760 hours.  ProBNP (last 3 results) No results for input(s): PROBNP in the last 8760 hours.  CBG: No results for input(s): GLUCAP in the last 168 hours.  Recent Results (from the past 240 hour(s))  Urine culture     Status: None (Preliminary result)   Collection Time: 08/24/15  4:50 PM  Result Value Ref Range Status   Specimen Description URINE, CLEAN CATCH  Final   Special Requests NONE  Final   Culture   Final    TOO YOUNG TO READ Performed at [facility]    Report Status PENDING  Incomplete     Studies: Dg Abd Acute W/chest  08/24/2015  CLINICAL DATA:  Nausea and vomiting. EXAM: DG ABDOMEN ACUTE W/ 1V CHEST COMPARISON:  Chest x-ray dated 12/12/2004 and CT scan abdomen dated 12/18/2004 FINDINGS: There is a large right peritracheal mass deviating the trachea to the left. This is new since 2006. This may represent enlargement of the right lobe of the thyroid gland. There is cardiomegaly with tortuosity  and calcification of the thoracic aorta. Pulmonary vascularity is normal. Slight accentuation of the interstitial markings at the lung bases. Chronic rotator cuff disease at both shoulders. Diffuse osteophytes throughout the spine. There are no dilated loops of large or small bowel. No free air. Moderate stool in the rectum consistent with a fecal impaction. Multiple phleboliths in the abdomen and pelvis. Extensive arterial vascular calcification. IMPRESSION: 1. Fecal impaction in the rectum. Otherwise benign appearing abdomen. 2. Large right peritracheal mass, new since 2006. This could represent a mass in the right lobe of the thyroid gland. Electronically Signed   By: Francene BoyersJames  Maxwell  M.D.   On: 08/24/2015 17:10    Scheduled Meds: . cefTRIAXone (ROCEPHIN)  IV  1 g Intravenous Q24H  . enoxaparin (LOVENOX) injection  40 mg Subcutaneous Q24H  . feeding supplement (ENSURE ENLIVE)  237 mL Oral BID BM  . hydrALAZINE  50 mg Oral 3 times per day  . polyethylene glycol  17 g Oral Daily  . senna  1 tablet Oral BID  . sodium chloride  500 mL Intravenous Once   Continuous Infusions: . sodium chloride 75 mL/hr at 08/25/15 1213    Principal Problem:   Constipation Active Problems:   Diabetes mellitus type 2, controlled, without complications (HCC)   Vascular dementia, uncomplicated   Essential hypertension, benign   Neck mass   UTI (urinary tract infection)    Time spent: 25 minutes.     [facility],Hallel Denherder  [facility] Pager [phone]  If 7PM-7AM, please contact night-coverage at [email or web address], password [email or web address] 08/25/2015, 4:10 PM

## 2015-08-26 DIAGNOSIS — I10 Essential (primary) hypertension: Secondary | ICD-10-CM | POA: Diagnosis not present

## 2015-08-26 DIAGNOSIS — K5641 Fecal impaction: Secondary | ICD-10-CM | POA: Diagnosis not present

## 2015-08-26 DIAGNOSIS — N390 Urinary tract infection, site not specified: Secondary | ICD-10-CM | POA: Diagnosis not present

## 2015-08-26 DIAGNOSIS — E119 Type 2 diabetes mellitus without complications: Secondary | ICD-10-CM | POA: Diagnosis not present

## 2015-08-26 LAB — BASIC METABOLIC PANEL
ANION GAP: 7 (ref 5–15)
BUN: 19 mg/dL (ref 6–20)
CO2: 25 mmol/L (ref 22–32)
Calcium: 8.8 mg/dL — ABNORMAL LOW (ref 8.9–10.3)
Chloride: 110 mmol/L (ref 101–111)
Creatinine, Ser: 0.89 mg/dL (ref 0.44–1.00)
GFR, EST NON AFRICAN AMERICAN: 53 mL/min — AB (ref >60)
GLUCOSE: 122 mg/dL — AB (ref 65–99)
POTASSIUM: 3.6 mmol/L (ref 3.5–5.1)
Sodium: 142 mmol/L (ref 135–145)

## 2015-08-26 LAB — URINE CULTURE

## 2015-08-26 LAB — LACTIC ACID, PLASMA: Lactic Acid, Venous: 1.8 mmol/L (ref 0.5–2.0)

## 2015-08-26 NOTE — Care Management Note (Signed)
Case Management Note  Patient Details  Name: Jeanette DroneHattie J Lampi MRN: [id number] Date of Birth: [birth year]  Subjective/Objective: 81 y/o f admitted w/constipation. From home.PT cons-await recommendations.                   Action/Plan:d/c plan home.   Expected Discharge Date:   (unknown)               Expected Discharge Plan:  Home w Home Health Services  In-House Referral:     Discharge planning Services  CM Consult  Post Acute Care Choice:    Choice offered to:     DME Arranged:    DME Agency:     HH Arranged:    HH Agency:     Status of Service:  In process, will continue to follow  Medicare Important Message Given:    Date Medicare IM Given:    Medicare IM give by:    Date Additional Medicare IM Given:    Additional Medicare Important Message give by:     If discussed at Long Length of Stay Meetings, dates discussed:    Additional Comments:  Lanier ClamMahabir, Lawrence Mitch, RN 08/26/2015, 2:55 PM

## 2015-08-26 NOTE — Progress Notes (Signed)
Initial Nutrition Assessment  DOCUMENTATION CODES:   Obesity unspecified  INTERVENTION:  Ensure Enlive po BID, each supplement provides 350 kcal and 20 grams of protein  NUTRITION DIAGNOSIS:   Inadequate oral intake related to poor appetite as evidenced by per patient/family report.  GOAL:   Patient will meet greater than or equal to 90% of their needs   MONITOR:   Supplement acceptance, PO intake, I & O's, Skin, Labs  REASON FOR ASSESSMENT:   Malnutrition Screening Tool    ASSESSMENT:  Jeanette [name] is a 81 y.o. female with Past medical history of dementia, diabetes mellitus, hypertension, neck mass. The patient is presenting with complaints of recurrent episodes of vomiting. The patient lives with one of her daughter but currently is living with another daughter since last one week. At her baseline the patient is fairly active and eats appropriately. Since last 2 days the patient has not been having adequate appetite. She has been fatigue and tired. They've also noted increased frequency of urination. Today the patient started having episodes of vomiting family mentions 4-5 episodes of nonbilious nonbloody vomiting. The patient did not have any complaints of abdominal pain chest pain nausea at the time of my evaluation. No fever no chills no diarrhea. Family is unaware of any history regarding bowel movements. No fall no trauma reported. The patient is coming from home.  At her baseline ambulates with support And is independent for most of her ADL; manages her medication on her own.  81 yo female who was admitted for constipation, has been having poor oral intake as of recent. When I spoke to her she was sleeping, pt has history of dementia. She responded to questions but comprehension varied from question to question. Pt told me she hadn't lost any weight and eats well but MD note reports she hasn't been eating as well as normal. Need pt family to confirm  information. Pt is enjoying ensure. PO intake varies from 10%-100%, averaging 60% over 4 meals. Monitor PO intake, MD work-up.    Diet Order:  Diet regular Room service appropriate?: Yes; Fluid consistency:: Thin  Skin:  Reviewed, no issues  Last BM:  08/26/2015  Height:   Ht Readings from Last 1 Encounters:  02/02/15 5\' 3"  (1.6 m)    Weight:   Wt Readings from Last 1 Encounters:  08/09/15 138 lb (62.596 kg)    Ideal Body Weight:  52.27 kg  BMI:  There is no weight on file to calculate BMI.  Estimated Nutritional Needs:   Kcal:  1500-1700  Protein:  70-80 grams  Fluid:  >/= 1.5L  EDUCATION NEEDS:   No education needs identified at this time  Jeanette AnoWilliam M. Marlane Hirschmann, MS, RD LDN After Hours/Weekend Pager [phone]

## 2015-08-26 NOTE — Progress Notes (Signed)
[facility] PROGRESS NOTE  Jeanette [name] [place]ess ZOX:[id number] DOB: 04/21/1917 DOA: 08/24/2015 PCP: Tillman Abide, MD  Assessment/Plan: 1. Nausea, vomiting:  Unclear etiology, gastroenteritis vs fecal impaction. Resolved. 2BM yesterday.  Symptoms have resolved.     Lactic acidosis: Improved Continue with fluids for 24 hours more.    Diabetes mellitus.  CBG (last 3)  No results for input(s): GLUCAP in the last 72 hours.  Diet controlled.    Vascular dementia: No agitation. Stable.    Hypokalemia: Replete as needed. And repeat in amshows normal.   UTI: Cultures  Show multiple bacterial morphotype's  and is on rocephin. Repeat cultures ordered.  She denies any dysuria symptoms today.  Hypertension: Well controlled.    Code Status: DNR Family Communication: discussed with daughter on 10/13 at bedside  Disposition Plan:pending PT eval.    Consultants:  none  Procedures:  none  Antibiotics:  Rocephin.   HPI/Subjective: Cheerful, denies any nausea or vomiting or diarrhea.  wantsto go Home in am.   Objective: Filed Vitals:   08/26/15 1335  BP: 109/71  Pulse: 66  Temp: 98.4 F (36.9 C)  Resp: 20    Intake/Output Summary (Last 24 hours) at 08/26/15 1711 Last data filed at 08/26/15 1425  Gross per 24 hour  Intake    360 ml  Output      0 ml  Net    360 ml   There were no vitals filed for this visit.  Exam:   General:  Alert afebrile comfortable  Cardiovascular: s1s2  Respiratory: ctab  Abdomen: soft NT ND BS+  Musculoskeletal: no pedal  Edema, cyanosis or clubbing.   Data Reviewed: Basic Metabolic Panel:  Recent Labs Lab 08/24/15 1720 08/25/15 0549 08/26/15 0525  NA 141 143 142  K 4.8 3.4* 3.6  CL 108 109 110  CO2 GLUCOSE 157* 96 122*  BUN CREATININE 0.83 0.73 0.89  CALCIUM 8.9 8.4* 8.8*   Liver Function Tests:  Recent Labs Lab 08/24/15 1720 08/25/15 0549  AST 46* 21  ALT 17 14  ALKPHOS  56 49  BILITOT 1.1 0.8  PROT 7.2 6.1*  ALBUMIN 3.9 3.3*    Recent Labs Lab 08/24/15 1720  LIPASE 25   No results for input(s): AMMONIA in the last 168 hours. CBC:  Recent Labs Lab 08/24/15 1720 08/25/15 0549  WBC 7.9 8.0  NEUTROABS 6.5  --   HGB 13.1 11.7*  HCT 39.2 35.5*  MCV 94.7 96.7  PLT 163 153   Cardiac Enzymes:  Recent Labs Lab 08/24/15 1720  TROPONINI <0.03   BNP (last 3 results) No results for input(s): BNP in the last 8760 hours.  ProBNP (last 3 results) No results for input(s): PROBNP in the last 8760 hours.  CBG: No results for input(s): GLUCAP in the last 168 hours.  Recent Results (from the past 240 hour(s))  Urine culture     Status: None   Collection Time: 08/24/15  4:50 PM  Result Value Ref Range Status   Specimen Description URINE, CLEAN CATCH  Final   Special Requests NONE  Final   Culture   Final    MULTIPLE SPECIES PRESENT, SUGGEST RECOLLECTION Performed at [facility]    Report Status 08/26/2015 FINAL  Final     Studies: No results found.  Scheduled Meds: . cefTRIAXone (ROCEPHIN)  IV  1 g Intravenous Q24H  . enoxaparin (LOVENOX) injection  40 mg Subcutaneous Q24H  . feeding supplement (  ENSURE ENLIVE)  237 mL Oral BID BM  . hydrALAZINE  50 mg Oral 3 times per day  . polyethylene glycol  17 g Oral Daily  . senna  1 tablet Oral BID  . sodium chloride  500 mL Intravenous Once   Continuous Infusions: . sodium chloride 75 mL/hr at 08/25/15 1213    Principal Problem:   Constipation Active Problems:   Diabetes mellitus type 2, controlled, without complications (HCC)   Vascular dementia, uncomplicated   Essential hypertension, benign   Neck mass   UTI (urinary tract infection)    Time spent: 15 minutes.     [facility] LLCKULA,Ivin Rosenbloom  [facility] Pager [phone]  If 7PM-7AM, please contact night-coverage at [email or web address], password [email or web address] 08/26/2015, 5:11 PM

## 2015-08-27 DIAGNOSIS — K5900 Constipation, unspecified: Secondary | ICD-10-CM | POA: Diagnosis not present

## 2015-08-27 DIAGNOSIS — E119 Type 2 diabetes mellitus without complications: Secondary | ICD-10-CM | POA: Diagnosis not present

## 2015-08-27 DIAGNOSIS — N390 Urinary tract infection, site not specified: Secondary | ICD-10-CM | POA: Diagnosis not present

## 2015-08-27 DIAGNOSIS — K5641 Fecal impaction: Secondary | ICD-10-CM | POA: Diagnosis not present

## 2015-08-27 MED ORDER — CIPROFLOXACIN HCL 250 MG PO TABS
250.0000 mg | ORAL_TABLET | Freq: Two times a day (BID) | ORAL | Status: DC
  Filled 2015-08-27 (×2): qty 1

## 2015-08-27 MED ORDER — CIPROFLOXACIN HCL 250 MG PO TABS
250.0000 mg | ORAL_TABLET | Freq: Two times a day (BID) | ORAL | Status: DC
Start: 2015-08-27 — End: 2015-08-31

## 2015-08-27 MED ORDER — CIPROFLOXACIN HCL 250 MG PO TABS: 250.0000 mg | ORAL_TABLET | Freq: Two times a day (BID) | ORAL | Status: DC

## 2015-08-27 NOTE — Evaluation (Signed)
Physical Therapy Evaluation Patient Details Name: Jeanette [name] MRN: [id number] DOB: [birth year] Today's Date: 08/27/2015   History of Present Illness  81 yo female ad with N/V  Clinical Impression  Patient evaluated by Physical Therapy with no further acute PT needs identified. All education has been completed and the patient has no further questions. See below for any follow-up Physial Therapy or equipment needs. PT is signing off. Thank you for this referral. Pt moving well, grossly min/guard assist with functional tasks, no family present but RN confirms her dtr assists in her care at home as needed; May benefit form HHPT at D/C; has been getting up with nsg staff, no further PT needs this venue;      Follow Up Recommendations Home health PT;Supervision/Assistance - 24 hour    Equipment Recommendations  None recommended by PT    Recommendations for Other Services       Precautions / Restrictions Precautions Precautions: Fall      Mobility  Bed Mobility Overal bed mobility: Needs Assistance Bed Mobility: Supine to Sit     Supine to sit: Min assist     General bed mobility comments: assist to bring trunk forward, pt reports her dtr helps her at home sometimes  Transfers Overall transfer level: Needs assistance Equipment used: Rolling walker (2 wheeled) Transfers: Sit to/from Stand Sit to Stand: Min guard;Supervision         General transfer comment: cues for safety (backing up all the way to chair) and hand placement  Ambulation/Gait Ambulation/Gait assistance: Min guard;Min assist Ambulation Distance (Feet): 20 Feet (in room, pt did not want to go out) Assistive device: Rolling walker (2 wheeled)       General Gait Details: assist to maneuver RW  Stairs            Wheelchair Mobility    Modified Rankin (Stroke Patients Only)       Balance Overall balance assessment: Needs assistance Sitting-balance support: Feet supported;No upper  extremity supported Sitting balance-Leahy Scale: Fair     Standing balance support: During functional activity;Bilateral upper extremity supported;No upper extremity supported Standing balance-Leahy Scale: Fair                               Pertinent Vitals/Pain Pain Assessment: No/denies pain    Home Living Family/patient expects to be discharged to:: Private residence Living Arrangements: Children Available Help at Discharge: Available 24 hours/day Type of Home: House         Home Equipment: Walker - 2 wheels      Prior Function Level of Independence: Independent with assistive device(s)         Comments: per pt report, family assists prn; states she amb with RW and that she is never by herself; no family present at time of eval     Hand Dominance        Extremity/Trunk Assessment   Upper Extremity Assessment: Overall WFL for tasks assessed           Lower Extremity Assessment: Overall WFL for tasks assessed         Communication   Communication: No difficulties  Cognition Arousal/Alertness: Awake/alert Behavior During Therapy: WFL for tasks assessed/performed Overall Cognitive Status: History of cognitive impairments - at baseline                      General Comments      Exercises  Assessment/Plan    PT Assessment All further PT needs can be met in the next venue of care  PT Diagnosis Difficulty walking   PT Problem List    PT Treatment Interventions     PT Goals (Current goals can be found in the Care Plan section) Acute Rehab PT Goals Patient Stated Goal: wants to go home PT Goal Formulation: All assessment and education complete, DC therapy    Frequency     Barriers to discharge        Co-evaluation               End of Session Equipment Utilized During Treatment: Gait belt Activity Tolerance: Patient tolerated treatment well Patient left: in chair;with call bell/phone within reach;with  chair alarm set Nurse Communication: Mobility status    Functional Assessment Tool Used: clinical judgement Functional Limitation: Mobility: Walking and moving around Mobility: Walking and Moving Around Current Status (P4982): At least 1 percent but less than 20 percent impaired, limited or restricted Mobility: Walking and Moving Around Goal Status [phone]): At least 1 percent but less than 20 percent impaired, limited or restricted Mobility: Walking and Moving Around Discharge Status [phone]): At least 1 percent but less than 20 percent impaired, limited or restricted    Time: 7680-8811 PT Time Calculation (min) (ACUTE ONLY): 13 min   Charges:   PT Evaluation $Initial PT Evaluation Tier I: 1 Procedure     PT G Codes:   PT G-Codes **NOT FOR INPATIENT CLASS** Functional Assessment Tool Used: clinical judgement Functional Limitation: Mobility: Walking and moving around Mobility: Walking and Moving Around Current Status (S3159): At least 1 percent but less than 20 percent impaired, limited or restricted Mobility: Walking and Moving Around Goal Status [phone]): At least 1 percent but less than 20 percent impaired, limited or restricted Mobility: Walking and Moving Around Discharge Status [phone]): At least 1 percent but less than 20 percent impaired, limited or restricted    [facility] 08/27/2015, 11:15 AM

## 2015-08-27 NOTE — Clinical Social Work Note (Addendum)
CSW received a call from RN stating that pt's family was going to take her home but now want SNF  CSW reviewed chart and pt is in OBS status.  CSW met with family to provide information regarding SNF's and how the insurance works with in patient and Obs status.  Per family pt has no money (only 1000) in her bank account) CSW  provided information regarding Medicaid and how that is the only insurance who will pay for SNF long term.  CSW explained how to proceed with obtaining medicaid for pt.  Pt's daughter and son Jeanette [name] and Jeanette [name]) very supportive and cooperative stating that they would follow up with medicaid on Monday.  Case manager is setting up home health services and CSW explained to family what the services may entail.  CSW let case manager know that family had come back to hospital and could meet with her when she was available.    Dede Query, LCSW [place] Worker - Weekend Coverage cell #: [phone]

## 2015-08-27 NOTE — Progress Notes (Signed)
Pt left at this time with her daughter and son. Discharge instructions/prescription given/explained to daughter and son with verbalization of understanding. Followup appointments noted.

## 2015-08-27 NOTE — Care Management Note (Addendum)
Case Management Note  Patient Details  Name: Jeanette [name] MRN: [id number] Date of Birth: 07/08/1917  Subjective/Objective:       Nausea and vomiting             Action/Plan: Home Health. Spoke to pt and gave permission to speak to dtr and son. NCM spoke to dtr, Jeanette [name] and son, [name] States pt has RW and 3n1 bedside commode at home. Family will arrange caregiver to be with pt 24 hours. Family will follow up with applying for Medicaid.   Expected Discharge Date:  08/27/2015              Expected Discharge Plan:  Home w Home Health Services  In-House Referral:     Discharge planning Services  CM Consult  Post Acute Care Choice:  Home Health Choice offered to:  Patient  HH Arranged:  RN, PT, OT, Nurse's Aide HH [facility]  Status of Service:  Completed, signed off  Medicare Important Message Given:    Date Medicare IM Given:    Medicare IM give by:    Date Additional Medicare IM Given:    Additional Medicare Important Message give by:     If discussed at Long Length of Stay Meetings, dates discussed:    Additional Comments:  Jeanette [name], Jeanette Loisel Ellen, RN 08/27/2015, 6:09 PM   08/28/2015 1229 Jeanette [name] [facility] with [facility]. Jeanette DonningAlesia Audon Heymann RN CCM Case Mgmt phone [phone]

## 2015-08-28 LAB — URINE CULTURE: CULTURE: NO GROWTH

## 2015-08-28 NOTE — Discharge Summary (Signed)
Physician Discharge Summary  Jeanette DroneHattie J Tirado LKG:[id number] DOB: 05-23-1917 DOA: 08/24/2015  PCP: Tillman Abideichard Letvak, MD  Admit date: 08/24/2015 Discharge date: 08/27/2015  Time spent: 30 minutes  Recommendations for Outpatient Follow-up:  1. Follow upw ith PCP in one week.   Discharge Diagnoses:  Principal Problem:   Constipation Active Problems:   Diabetes mellitus type 2, controlled, without complications (HCC)   Vascular dementia, uncomplicated   Essential hypertension, benign   Neck mass   UTI (urinary tract infection)   Discharge Condition: improved  Diet recommendation: regular   There were no vitals filed for this visit.  History of present illness:  Jeanette [name] is a 81 y.o. female with Past medical history of dementia, diabetes mellitus, hypertension, neck mass presenting with complaints of recurrent episodes of vomiting  Hospital Course:  1. Nausea, vomiting: Unclear etiology, gastroenteritis vs fecal impaction. Resolved. Symptoms have resolved.     Lactic acidosis: Improved    Diabetes mellitus.  CBG (last 3)   Recent Labs (last 2 labs)     No results for input(s): GLUCAP in the last 72 hours.    Diet controlled.    Vascular dementia: No agitation. Stable.    Hypokalemia: Replete as needed. And repeat in amshows normal.   UTI: Cultures Show multiple bacterial morphotype's and is on rocephin. Repeat cultures ordered and negative. .  She denies any dysuria symptoms today.  Hypertension: Well controlled.         Procedures:  none  Consultations: none Discharge Exam: Filed Vitals:   08/27/15 1500  BP: 138/74  Pulse: 72  Temp: 97.3 F (36.3 C)  Resp: 18    General: alert afebrile comfortable Cardiovascular: s1s2 Respiratory: ctab  Discharge Instructions   Discharge Instructions    Diet - low sodium heart healthy    Complete by:  As directed      Discharge instructions    Complete by:  As directed   Follow up  with PCP in one week.  Please follow up with PT, RN as recommended.          Discharge Medication List as of 08/27/2015  5:13 PM    START taking these medications   Details  ciprofloxacin (CIPRO) 250 MG tablet Take 1 tablet (250 mg total) by mouth 2 (two) times daily., Starting 08/27/2015, Until Discontinued, Print      CONTINUE these medications which have NOT CHANGED   Details  amLODipine (NORVASC) 10 MG tablet TAKE 1 TABLET BY MOUTH DAILY, Normal    hydrALAZINE (APRESOLINE) 50 MG tablet TAKE 1 TABLET (50 MG TOTAL) BY MOUTH 2 (TWO) TIMES DAILY., Normal    traMADol (ULTRAM) 50 MG tablet Take 1 tablet (50 mg total) by mouth 3 (three) times daily as needed., Starting 08/09/2015, Until Discontinued, No Print      STOP taking these medications     triamterene-hydrochlorothiazide (MAXZIDE-25) 37.5-25 MG tablet        No Known Allergies Follow-up Information    Follow up with Tillman Abideichard Letvak, MD. Schedule an appointment as soon as possible for a visit in 1 week.   Specialties:  Internal Medicine, Pediatrics   Contact information:   [address] [place] [address] [phone]       [facility]   Why:  Home Health Physical Therapy, aide, RN    Contact information:   [address] [place] [address] [phone]        The results  of significant diagnostics from this hospitalization (including imaging, microbiology, ancillary and laboratory) are listed below for reference.    Significant Diagnostic Studies: Dg Abd Acute W/chest  08/24/2015  CLINICAL DATA:  Nausea and vomiting. EXAM: DG ABDOMEN ACUTE W/ 1V CHEST COMPARISON:  Chest x-ray dated 12/12/2004 and CT scan abdomen dated 12/18/2004 FINDINGS: There is a large right peritracheal mass deviating the trachea to the left. This is new since 2006. This may represent enlargement of the right lobe of the thyroid gland. There is cardiomegaly with tortuosity and calcification  of the thoracic aorta. Pulmonary vascularity is normal. Slight accentuation of the interstitial markings at the lung bases. Chronic rotator cuff disease at both shoulders. Diffuse osteophytes throughout the spine. There are no dilated loops of large or small bowel. No free air. Moderate stool in the rectum consistent with a fecal impaction. Multiple phleboliths in the abdomen and pelvis. Extensive arterial vascular calcification. IMPRESSION: 1. Fecal impaction in the rectum. Otherwise benign appearing abdomen. 2. Large right peritracheal mass, new since 2006. This could represent a mass in the right lobe of the thyroid gland. Electronically Signed   By: Francene Boyers M.D.   On: 08/24/2015 17:10    Microbiology: Recent Results (from the past 240 hour(s))  Urine culture     Status: None   Collection Time: 08/24/15  4:50 PM  Result Value Ref Range Status   Specimen Description URINE, CLEAN CATCH  Final   Special Requests NONE  Final   Culture   Final    MULTIPLE SPECIES PRESENT, SUGGEST RECOLLECTION Performed at [facility]    Report Status 08/26/2015 FINAL  Final  Culture, Urine     Status: None   Collection Time: 08/26/15  1:30 PM  Result Value Ref Range Status   Specimen Description URINE, CLEAN CATCH  Final   Special Requests NONE  Final   Culture   Final    NO GROWTH 2 DAYS Performed at [facility]    Report Status 08/28/2015 FINAL  Final     Labs: Basic Metabolic Panel:  Recent Labs Lab 08/24/15 1720 08/25/15 0549 08/26/15 0525  NA 141 143 142  K 4.8 3.4* 3.6  CL 108 109 110  CO2 GLUCOSE 157* 96 122*  BUN CREATININE 0.83 0.73 0.89  CALCIUM 8.9 8.4* 8.8*   Liver Function Tests:  Recent Labs Lab 08/24/15 1720 08/25/15 0549  AST 46* 21  ALT 17 14  ALKPHOS 56 49  BILITOT 1.1 0.8  PROT 7.2 6.1*  ALBUMIN 3.9 3.3*    Recent Labs Lab 08/24/15 1720  LIPASE 25   No results for input(s): AMMONIA in the last 168  hours. CBC:  Recent Labs Lab 08/24/15 1720 08/25/15 0549  WBC 7.9 8.0  NEUTROABS 6.5  --   HGB 13.1 11.7*  HCT 39.2 35.5*  MCV 94.7 96.7  PLT 163 153   Cardiac Enzymes:  Recent Labs Lab 08/24/15 1720  TROPONINI <0.03   BNP: BNP (last 3 results) No results for input(s): BNP in the last 8760 hours.  ProBNP (last 3 results) No results for input(s): PROBNP in the last 8760 hours.  CBG: No results for input(s): GLUCAP in the last 168 hours.     SignedKathlen Mody  [facility] 08/28/2015, 6:38 PM

## 2015-08-29 ENCOUNTER — Telehealth: Payer: Self-pay | Admitting: *Deleted

## 2015-08-29 NOTE — Telephone Encounter (Signed)
Transitional care call attempted.  Called patients daughter [name], [place].  No answer, no voice mail to leave a message.  Will continue attempts to contact patient.

## 2015-08-30 ENCOUNTER — Telehealth: Payer: Self-pay | Admitting: Internal Medicine

## 2015-08-30 NOTE — Telephone Encounter (Signed)
[facility] called to say the are putting Jeanette [name] in skilled nursing.   When they decide where they are going place her they will bring FL2 form to be filled out

## 2015-08-30 NOTE — Telephone Encounter (Signed)
Transitional care call attempted.  Called patient's daughter [name], per [place].  No answer, no voice mail to leave a message.  Will continue attempts to contact patient/family.

## 2015-08-30 NOTE — Telephone Encounter (Signed)
Daughter Griffith CitronLiddie [phone]) walked in asking what they needed to do in order to get patient into a nursing home? Per daughter pt can't do anything for herself now, daughter states her sister who was taking care of and living with patient can no longer help because she's had knee surgery and will be having the other knee done soon. Per daughter pt can't even dress herself, get out of bed or make her own coffee anymore. Daughter states they are at the end of the rope and doesn't know what to do, they found a home but the home is stating Dr. Alphonsus SiasLetvak needs to fill out FL-2 form. Please advise

## 2015-08-31 NOTE — Telephone Encounter (Signed)
Patients daughter notified form is ready for pick up 

## 2015-08-31 NOTE — Telephone Encounter (Signed)
Form done No charge Call daughter to pick up

## 2015-09-01 NOTE — Telephone Encounter (Signed)
Patient placed in SNF

## 2015-09-02 DIAGNOSIS — R488 Other symbolic dysfunctions: Secondary | ICD-10-CM | POA: Diagnosis not present

## 2015-09-02 DIAGNOSIS — R2689 Other abnormalities of gait and mobility: Secondary | ICD-10-CM | POA: Diagnosis not present

## 2015-09-02 DIAGNOSIS — M6281 Muscle weakness (generalized): Secondary | ICD-10-CM | POA: Diagnosis not present

## 2015-09-05 DIAGNOSIS — R2689 Other abnormalities of gait and mobility: Secondary | ICD-10-CM | POA: Diagnosis not present

## 2015-09-05 DIAGNOSIS — M6281 Muscle weakness (generalized): Secondary | ICD-10-CM | POA: Diagnosis not present

## 2015-09-05 DIAGNOSIS — R488 Other symbolic dysfunctions: Secondary | ICD-10-CM | POA: Diagnosis not present

## 2015-09-06 DIAGNOSIS — M6281 Muscle weakness (generalized): Secondary | ICD-10-CM | POA: Diagnosis not present

## 2015-09-06 DIAGNOSIS — R2689 Other abnormalities of gait and mobility: Secondary | ICD-10-CM | POA: Diagnosis not present

## 2015-09-06 DIAGNOSIS — R488 Other symbolic dysfunctions: Secondary | ICD-10-CM | POA: Diagnosis not present

## 2015-09-07 DIAGNOSIS — I10 Essential (primary) hypertension: Secondary | ICD-10-CM | POA: Diagnosis not present

## 2015-09-07 DIAGNOSIS — R2689 Other abnormalities of gait and mobility: Secondary | ICD-10-CM | POA: Diagnosis not present

## 2015-09-07 DIAGNOSIS — E119 Type 2 diabetes mellitus without complications: Secondary | ICD-10-CM | POA: Diagnosis not present

## 2015-09-07 DIAGNOSIS — R488 Other symbolic dysfunctions: Secondary | ICD-10-CM | POA: Diagnosis not present

## 2015-09-07 DIAGNOSIS — F0390 Unspecified dementia without behavioral disturbance: Secondary | ICD-10-CM | POA: Diagnosis not present

## 2015-09-07 DIAGNOSIS — M6281 Muscle weakness (generalized): Secondary | ICD-10-CM | POA: Diagnosis not present

## 2015-09-08 DIAGNOSIS — M6281 Muscle weakness (generalized): Secondary | ICD-10-CM | POA: Diagnosis not present

## 2015-09-08 DIAGNOSIS — R2689 Other abnormalities of gait and mobility: Secondary | ICD-10-CM | POA: Diagnosis not present

## 2015-09-08 DIAGNOSIS — R488 Other symbolic dysfunctions: Secondary | ICD-10-CM | POA: Diagnosis not present

## 2015-09-09 DIAGNOSIS — M6281 Muscle weakness (generalized): Secondary | ICD-10-CM | POA: Diagnosis not present

## 2015-09-09 DIAGNOSIS — R488 Other symbolic dysfunctions: Secondary | ICD-10-CM | POA: Diagnosis not present

## 2015-09-09 DIAGNOSIS — R2689 Other abnormalities of gait and mobility: Secondary | ICD-10-CM | POA: Diagnosis not present

## 2015-09-11 DIAGNOSIS — M6281 Muscle weakness (generalized): Secondary | ICD-10-CM | POA: Diagnosis not present

## 2015-09-11 DIAGNOSIS — R2689 Other abnormalities of gait and mobility: Secondary | ICD-10-CM | POA: Diagnosis not present

## 2015-09-11 DIAGNOSIS — R488 Other symbolic dysfunctions: Secondary | ICD-10-CM | POA: Diagnosis not present

## 2015-09-12 DIAGNOSIS — R2689 Other abnormalities of gait and mobility: Secondary | ICD-10-CM | POA: Diagnosis not present

## 2015-09-12 DIAGNOSIS — M6281 Muscle weakness (generalized): Secondary | ICD-10-CM | POA: Diagnosis not present

## 2015-09-12 DIAGNOSIS — R488 Other symbolic dysfunctions: Secondary | ICD-10-CM | POA: Diagnosis not present

## 2015-09-13 DIAGNOSIS — R2689 Other abnormalities of gait and mobility: Secondary | ICD-10-CM | POA: Diagnosis not present

## 2015-09-13 DIAGNOSIS — R488 Other symbolic dysfunctions: Secondary | ICD-10-CM | POA: Diagnosis not present

## 2015-09-13 DIAGNOSIS — M6281 Muscle weakness (generalized): Secondary | ICD-10-CM | POA: Diagnosis not present

## 2015-09-14 DIAGNOSIS — R2689 Other abnormalities of gait and mobility: Secondary | ICD-10-CM | POA: Diagnosis not present

## 2015-09-14 DIAGNOSIS — R488 Other symbolic dysfunctions: Secondary | ICD-10-CM | POA: Diagnosis not present

## 2015-09-14 DIAGNOSIS — M6281 Muscle weakness (generalized): Secondary | ICD-10-CM | POA: Diagnosis not present

## 2015-09-15 DIAGNOSIS — R2689 Other abnormalities of gait and mobility: Secondary | ICD-10-CM | POA: Diagnosis not present

## 2015-09-15 DIAGNOSIS — M6281 Muscle weakness (generalized): Secondary | ICD-10-CM | POA: Diagnosis not present

## 2015-09-15 DIAGNOSIS — R488 Other symbolic dysfunctions: Secondary | ICD-10-CM | POA: Diagnosis not present

## 2015-09-16 DIAGNOSIS — R2689 Other abnormalities of gait and mobility: Secondary | ICD-10-CM | POA: Diagnosis not present

## 2015-09-16 DIAGNOSIS — M6281 Muscle weakness (generalized): Secondary | ICD-10-CM | POA: Diagnosis not present

## 2015-09-16 DIAGNOSIS — R488 Other symbolic dysfunctions: Secondary | ICD-10-CM | POA: Diagnosis not present

## 2015-09-19 DIAGNOSIS — R488 Other symbolic dysfunctions: Secondary | ICD-10-CM | POA: Diagnosis not present

## 2015-09-19 DIAGNOSIS — M6281 Muscle weakness (generalized): Secondary | ICD-10-CM | POA: Diagnosis not present

## 2015-09-19 DIAGNOSIS — R2689 Other abnormalities of gait and mobility: Secondary | ICD-10-CM | POA: Diagnosis not present

## 2015-09-20 DIAGNOSIS — R2689 Other abnormalities of gait and mobility: Secondary | ICD-10-CM | POA: Diagnosis not present

## 2015-09-20 DIAGNOSIS — R488 Other symbolic dysfunctions: Secondary | ICD-10-CM | POA: Diagnosis not present

## 2015-09-20 DIAGNOSIS — M6281 Muscle weakness (generalized): Secondary | ICD-10-CM | POA: Diagnosis not present

## 2015-09-21 DIAGNOSIS — R488 Other symbolic dysfunctions: Secondary | ICD-10-CM | POA: Diagnosis not present

## 2015-09-21 DIAGNOSIS — M6281 Muscle weakness (generalized): Secondary | ICD-10-CM | POA: Diagnosis not present

## 2015-09-21 DIAGNOSIS — R2689 Other abnormalities of gait and mobility: Secondary | ICD-10-CM | POA: Diagnosis not present

## 2015-09-22 DIAGNOSIS — M6281 Muscle weakness (generalized): Secondary | ICD-10-CM | POA: Diagnosis not present

## 2015-09-22 DIAGNOSIS — R488 Other symbolic dysfunctions: Secondary | ICD-10-CM | POA: Diagnosis not present

## 2015-09-22 DIAGNOSIS — R2689 Other abnormalities of gait and mobility: Secondary | ICD-10-CM | POA: Diagnosis not present

## 2015-09-23 DIAGNOSIS — R2689 Other abnormalities of gait and mobility: Secondary | ICD-10-CM | POA: Diagnosis not present

## 2015-09-23 DIAGNOSIS — R488 Other symbolic dysfunctions: Secondary | ICD-10-CM | POA: Diagnosis not present

## 2015-09-23 DIAGNOSIS — M6281 Muscle weakness (generalized): Secondary | ICD-10-CM | POA: Diagnosis not present

## 2015-09-24 DIAGNOSIS — M6281 Muscle weakness (generalized): Secondary | ICD-10-CM | POA: Diagnosis not present

## 2015-09-24 DIAGNOSIS — R2689 Other abnormalities of gait and mobility: Secondary | ICD-10-CM | POA: Diagnosis not present

## 2015-09-24 DIAGNOSIS — R488 Other symbolic dysfunctions: Secondary | ICD-10-CM | POA: Diagnosis not present

## 2015-09-26 DIAGNOSIS — R2689 Other abnormalities of gait and mobility: Secondary | ICD-10-CM | POA: Diagnosis not present

## 2015-09-26 DIAGNOSIS — R488 Other symbolic dysfunctions: Secondary | ICD-10-CM | POA: Diagnosis not present

## 2015-09-26 DIAGNOSIS — M6281 Muscle weakness (generalized): Secondary | ICD-10-CM | POA: Diagnosis not present

## 2015-09-27 DIAGNOSIS — R2689 Other abnormalities of gait and mobility: Secondary | ICD-10-CM | POA: Diagnosis not present

## 2015-09-27 DIAGNOSIS — M6281 Muscle weakness (generalized): Secondary | ICD-10-CM | POA: Diagnosis not present

## 2015-09-27 DIAGNOSIS — R488 Other symbolic dysfunctions: Secondary | ICD-10-CM | POA: Diagnosis not present

## 2015-09-28 DIAGNOSIS — M6281 Muscle weakness (generalized): Secondary | ICD-10-CM | POA: Diagnosis not present

## 2015-09-28 DIAGNOSIS — R488 Other symbolic dysfunctions: Secondary | ICD-10-CM | POA: Diagnosis not present

## 2015-09-28 DIAGNOSIS — R2689 Other abnormalities of gait and mobility: Secondary | ICD-10-CM | POA: Diagnosis not present

## 2015-09-29 DIAGNOSIS — R488 Other symbolic dysfunctions: Secondary | ICD-10-CM | POA: Diagnosis not present

## 2015-09-29 DIAGNOSIS — R2689 Other abnormalities of gait and mobility: Secondary | ICD-10-CM | POA: Diagnosis not present

## 2015-09-29 DIAGNOSIS — M6281 Muscle weakness (generalized): Secondary | ICD-10-CM | POA: Diagnosis not present

## 2015-09-30 DIAGNOSIS — M6281 Muscle weakness (generalized): Secondary | ICD-10-CM | POA: Diagnosis not present

## 2015-09-30 DIAGNOSIS — R2689 Other abnormalities of gait and mobility: Secondary | ICD-10-CM | POA: Diagnosis not present

## 2015-09-30 DIAGNOSIS — R488 Other symbolic dysfunctions: Secondary | ICD-10-CM | POA: Diagnosis not present

## 2015-10-02 DIAGNOSIS — R2689 Other abnormalities of gait and mobility: Secondary | ICD-10-CM | POA: Diagnosis not present

## 2015-10-02 DIAGNOSIS — M6281 Muscle weakness (generalized): Secondary | ICD-10-CM | POA: Diagnosis not present

## 2015-10-02 DIAGNOSIS — R488 Other symbolic dysfunctions: Secondary | ICD-10-CM | POA: Diagnosis not present

## 2015-10-03 DIAGNOSIS — M6281 Muscle weakness (generalized): Secondary | ICD-10-CM | POA: Diagnosis not present

## 2015-10-03 DIAGNOSIS — R2689 Other abnormalities of gait and mobility: Secondary | ICD-10-CM | POA: Diagnosis not present

## 2015-10-03 DIAGNOSIS — R488 Other symbolic dysfunctions: Secondary | ICD-10-CM | POA: Diagnosis not present

## 2015-10-04 DIAGNOSIS — R2689 Other abnormalities of gait and mobility: Secondary | ICD-10-CM | POA: Diagnosis not present

## 2015-10-04 DIAGNOSIS — R488 Other symbolic dysfunctions: Secondary | ICD-10-CM | POA: Diagnosis not present

## 2015-10-04 DIAGNOSIS — M6281 Muscle weakness (generalized): Secondary | ICD-10-CM | POA: Diagnosis not present

## 2015-10-05 DIAGNOSIS — F0390 Unspecified dementia without behavioral disturbance: Secondary | ICD-10-CM | POA: Diagnosis not present

## 2015-10-05 DIAGNOSIS — Z66 Do not resuscitate: Secondary | ICD-10-CM | POA: Diagnosis not present

## 2015-10-05 DIAGNOSIS — E119 Type 2 diabetes mellitus without complications: Secondary | ICD-10-CM | POA: Diagnosis not present

## 2015-10-05 DIAGNOSIS — R2689 Other abnormalities of gait and mobility: Secondary | ICD-10-CM | POA: Diagnosis not present

## 2015-10-05 DIAGNOSIS — M6281 Muscle weakness (generalized): Secondary | ICD-10-CM | POA: Diagnosis not present

## 2015-10-05 DIAGNOSIS — I10 Essential (primary) hypertension: Secondary | ICD-10-CM | POA: Diagnosis not present

## 2015-10-05 DIAGNOSIS — R488 Other symbolic dysfunctions: Secondary | ICD-10-CM | POA: Diagnosis not present

## 2015-10-10 DIAGNOSIS — M6281 Muscle weakness (generalized): Secondary | ICD-10-CM | POA: Diagnosis not present

## 2015-10-10 DIAGNOSIS — R488 Other symbolic dysfunctions: Secondary | ICD-10-CM | POA: Diagnosis not present

## 2015-10-10 DIAGNOSIS — R2689 Other abnormalities of gait and mobility: Secondary | ICD-10-CM | POA: Diagnosis not present

## 2015-10-11 DIAGNOSIS — R488 Other symbolic dysfunctions: Secondary | ICD-10-CM | POA: Diagnosis not present

## 2015-10-11 DIAGNOSIS — R2689 Other abnormalities of gait and mobility: Secondary | ICD-10-CM | POA: Diagnosis not present

## 2015-10-11 DIAGNOSIS — M6281 Muscle weakness (generalized): Secondary | ICD-10-CM | POA: Diagnosis not present

## 2015-10-12 DIAGNOSIS — R2689 Other abnormalities of gait and mobility: Secondary | ICD-10-CM | POA: Diagnosis not present

## 2015-10-12 DIAGNOSIS — M6281 Muscle weakness (generalized): Secondary | ICD-10-CM | POA: Diagnosis not present

## 2015-10-12 DIAGNOSIS — R488 Other symbolic dysfunctions: Secondary | ICD-10-CM | POA: Diagnosis not present

## 2015-11-30 DIAGNOSIS — E7800 Pure hypercholesterolemia, unspecified: Secondary | ICD-10-CM | POA: Diagnosis not present

## 2015-11-30 DIAGNOSIS — E119 Type 2 diabetes mellitus without complications: Secondary | ICD-10-CM | POA: Diagnosis not present

## 2015-11-30 DIAGNOSIS — I10 Essential (primary) hypertension: Secondary | ICD-10-CM | POA: Diagnosis not present

## 2015-11-30 DIAGNOSIS — F0390 Unspecified dementia without behavioral disturbance: Secondary | ICD-10-CM | POA: Diagnosis not present

## 2015-12-08 DIAGNOSIS — L84 Corns and callosities: Secondary | ICD-10-CM | POA: Diagnosis not present

## 2015-12-08 DIAGNOSIS — M6281 Muscle weakness (generalized): Secondary | ICD-10-CM | POA: Diagnosis not present

## 2015-12-08 DIAGNOSIS — E1151 Type 2 diabetes mellitus with diabetic peripheral angiopathy without gangrene: Secondary | ICD-10-CM | POA: Diagnosis not present

## 2015-12-08 DIAGNOSIS — R488 Other symbolic dysfunctions: Secondary | ICD-10-CM | POA: Diagnosis not present

## 2015-12-09 DIAGNOSIS — R488 Other symbolic dysfunctions: Secondary | ICD-10-CM | POA: Diagnosis not present

## 2015-12-09 DIAGNOSIS — M6281 Muscle weakness (generalized): Secondary | ICD-10-CM | POA: Diagnosis not present

## 2015-12-10 DIAGNOSIS — M6281 Muscle weakness (generalized): Secondary | ICD-10-CM | POA: Diagnosis not present

## 2015-12-10 DIAGNOSIS — R488 Other symbolic dysfunctions: Secondary | ICD-10-CM | POA: Diagnosis not present

## 2015-12-12 DIAGNOSIS — M6281 Muscle weakness (generalized): Secondary | ICD-10-CM | POA: Diagnosis not present

## 2015-12-12 DIAGNOSIS — R488 Other symbolic dysfunctions: Secondary | ICD-10-CM | POA: Diagnosis not present

## 2015-12-13 DIAGNOSIS — M6281 Muscle weakness (generalized): Secondary | ICD-10-CM | POA: Diagnosis not present

## 2015-12-13 DIAGNOSIS — R488 Other symbolic dysfunctions: Secondary | ICD-10-CM | POA: Diagnosis not present

## 2015-12-14 DIAGNOSIS — R1319 Other dysphagia: Secondary | ICD-10-CM | POA: Diagnosis not present

## 2015-12-14 DIAGNOSIS — M175 Other unilateral secondary osteoarthritis of knee: Secondary | ICD-10-CM | POA: Diagnosis not present

## 2015-12-14 DIAGNOSIS — R41841 Cognitive communication deficit: Secondary | ICD-10-CM | POA: Diagnosis not present

## 2015-12-14 DIAGNOSIS — R1312 Dysphagia, oropharyngeal phase: Secondary | ICD-10-CM | POA: Diagnosis not present

## 2015-12-14 DIAGNOSIS — M25562 Pain in left knee: Secondary | ICD-10-CM | POA: Diagnosis not present

## 2015-12-14 DIAGNOSIS — M6281 Muscle weakness (generalized): Secondary | ICD-10-CM | POA: Diagnosis not present

## 2015-12-14 DIAGNOSIS — R262 Difficulty in walking, not elsewhere classified: Secondary | ICD-10-CM | POA: Diagnosis not present

## 2015-12-14 DIAGNOSIS — E785 Hyperlipidemia, unspecified: Secondary | ICD-10-CM | POA: Diagnosis not present

## 2015-12-14 DIAGNOSIS — E119 Type 2 diabetes mellitus without complications: Secondary | ICD-10-CM | POA: Diagnosis not present

## 2015-12-14 DIAGNOSIS — I10 Essential (primary) hypertension: Secondary | ICD-10-CM | POA: Diagnosis not present

## 2015-12-15 DIAGNOSIS — R262 Difficulty in walking, not elsewhere classified: Secondary | ICD-10-CM | POA: Diagnosis not present

## 2015-12-15 DIAGNOSIS — M175 Other unilateral secondary osteoarthritis of knee: Secondary | ICD-10-CM | POA: Diagnosis not present

## 2015-12-15 DIAGNOSIS — M6281 Muscle weakness (generalized): Secondary | ICD-10-CM | POA: Diagnosis not present

## 2015-12-15 DIAGNOSIS — R41841 Cognitive communication deficit: Secondary | ICD-10-CM | POA: Diagnosis not present

## 2015-12-15 DIAGNOSIS — M25562 Pain in left knee: Secondary | ICD-10-CM | POA: Diagnosis not present

## 2015-12-15 DIAGNOSIS — R1319 Other dysphagia: Secondary | ICD-10-CM | POA: Diagnosis not present

## 2015-12-16 DIAGNOSIS — R41841 Cognitive communication deficit: Secondary | ICD-10-CM | POA: Diagnosis not present

## 2015-12-16 DIAGNOSIS — R262 Difficulty in walking, not elsewhere classified: Secondary | ICD-10-CM | POA: Diagnosis not present

## 2015-12-16 DIAGNOSIS — M6281 Muscle weakness (generalized): Secondary | ICD-10-CM | POA: Diagnosis not present

## 2015-12-16 DIAGNOSIS — R1319 Other dysphagia: Secondary | ICD-10-CM | POA: Diagnosis not present

## 2015-12-16 DIAGNOSIS — M175 Other unilateral secondary osteoarthritis of knee: Secondary | ICD-10-CM | POA: Diagnosis not present

## 2015-12-16 DIAGNOSIS — M25562 Pain in left knee: Secondary | ICD-10-CM | POA: Diagnosis not present

## 2015-12-19 DIAGNOSIS — R41841 Cognitive communication deficit: Secondary | ICD-10-CM | POA: Diagnosis not present

## 2015-12-19 DIAGNOSIS — R1319 Other dysphagia: Secondary | ICD-10-CM | POA: Diagnosis not present

## 2015-12-19 DIAGNOSIS — R262 Difficulty in walking, not elsewhere classified: Secondary | ICD-10-CM | POA: Diagnosis not present

## 2015-12-19 DIAGNOSIS — M175 Other unilateral secondary osteoarthritis of knee: Secondary | ICD-10-CM | POA: Diagnosis not present

## 2015-12-19 DIAGNOSIS — M6281 Muscle weakness (generalized): Secondary | ICD-10-CM | POA: Diagnosis not present

## 2015-12-19 DIAGNOSIS — M25562 Pain in left knee: Secondary | ICD-10-CM | POA: Diagnosis not present

## 2015-12-20 DIAGNOSIS — R262 Difficulty in walking, not elsewhere classified: Secondary | ICD-10-CM | POA: Diagnosis not present

## 2015-12-20 DIAGNOSIS — R41841 Cognitive communication deficit: Secondary | ICD-10-CM | POA: Diagnosis not present

## 2015-12-20 DIAGNOSIS — R1319 Other dysphagia: Secondary | ICD-10-CM | POA: Diagnosis not present

## 2015-12-20 DIAGNOSIS — M175 Other unilateral secondary osteoarthritis of knee: Secondary | ICD-10-CM | POA: Diagnosis not present

## 2015-12-20 DIAGNOSIS — M25562 Pain in left knee: Secondary | ICD-10-CM | POA: Diagnosis not present

## 2015-12-20 DIAGNOSIS — M6281 Muscle weakness (generalized): Secondary | ICD-10-CM | POA: Diagnosis not present

## 2015-12-21 DIAGNOSIS — R262 Difficulty in walking, not elsewhere classified: Secondary | ICD-10-CM | POA: Diagnosis not present

## 2015-12-21 DIAGNOSIS — M175 Other unilateral secondary osteoarthritis of knee: Secondary | ICD-10-CM | POA: Diagnosis not present

## 2015-12-21 DIAGNOSIS — R1319 Other dysphagia: Secondary | ICD-10-CM | POA: Diagnosis not present

## 2015-12-21 DIAGNOSIS — R41841 Cognitive communication deficit: Secondary | ICD-10-CM | POA: Diagnosis not present

## 2015-12-21 DIAGNOSIS — M6281 Muscle weakness (generalized): Secondary | ICD-10-CM | POA: Diagnosis not present

## 2015-12-21 DIAGNOSIS — M25562 Pain in left knee: Secondary | ICD-10-CM | POA: Diagnosis not present

## 2015-12-22 DIAGNOSIS — M175 Other unilateral secondary osteoarthritis of knee: Secondary | ICD-10-CM | POA: Diagnosis not present

## 2015-12-22 DIAGNOSIS — R262 Difficulty in walking, not elsewhere classified: Secondary | ICD-10-CM | POA: Diagnosis not present

## 2015-12-22 DIAGNOSIS — M6281 Muscle weakness (generalized): Secondary | ICD-10-CM | POA: Diagnosis not present

## 2015-12-22 DIAGNOSIS — M25562 Pain in left knee: Secondary | ICD-10-CM | POA: Diagnosis not present

## 2015-12-22 DIAGNOSIS — R1319 Other dysphagia: Secondary | ICD-10-CM | POA: Diagnosis not present

## 2015-12-22 DIAGNOSIS — R41841 Cognitive communication deficit: Secondary | ICD-10-CM | POA: Diagnosis not present

## 2015-12-23 DIAGNOSIS — R1319 Other dysphagia: Secondary | ICD-10-CM | POA: Diagnosis not present

## 2015-12-23 DIAGNOSIS — M6281 Muscle weakness (generalized): Secondary | ICD-10-CM | POA: Diagnosis not present

## 2015-12-23 DIAGNOSIS — R41841 Cognitive communication deficit: Secondary | ICD-10-CM | POA: Diagnosis not present

## 2015-12-23 DIAGNOSIS — M25562 Pain in left knee: Secondary | ICD-10-CM | POA: Diagnosis not present

## 2015-12-23 DIAGNOSIS — M175 Other unilateral secondary osteoarthritis of knee: Secondary | ICD-10-CM | POA: Diagnosis not present

## 2015-12-23 DIAGNOSIS — R262 Difficulty in walking, not elsewhere classified: Secondary | ICD-10-CM | POA: Diagnosis not present

## 2015-12-26 DIAGNOSIS — M6281 Muscle weakness (generalized): Secondary | ICD-10-CM | POA: Diagnosis not present

## 2015-12-26 DIAGNOSIS — M25562 Pain in left knee: Secondary | ICD-10-CM | POA: Diagnosis not present

## 2015-12-26 DIAGNOSIS — M175 Other unilateral secondary osteoarthritis of knee: Secondary | ICD-10-CM | POA: Diagnosis not present

## 2015-12-26 DIAGNOSIS — R1319 Other dysphagia: Secondary | ICD-10-CM | POA: Diagnosis not present

## 2015-12-26 DIAGNOSIS — R41841 Cognitive communication deficit: Secondary | ICD-10-CM | POA: Diagnosis not present

## 2015-12-26 DIAGNOSIS — R262 Difficulty in walking, not elsewhere classified: Secondary | ICD-10-CM | POA: Diagnosis not present

## 2015-12-27 DIAGNOSIS — M175 Other unilateral secondary osteoarthritis of knee: Secondary | ICD-10-CM | POA: Diagnosis not present

## 2015-12-27 DIAGNOSIS — M6281 Muscle weakness (generalized): Secondary | ICD-10-CM | POA: Diagnosis not present

## 2015-12-27 DIAGNOSIS — M25562 Pain in left knee: Secondary | ICD-10-CM | POA: Diagnosis not present

## 2015-12-27 DIAGNOSIS — R41841 Cognitive communication deficit: Secondary | ICD-10-CM | POA: Diagnosis not present

## 2015-12-27 DIAGNOSIS — R1319 Other dysphagia: Secondary | ICD-10-CM | POA: Diagnosis not present

## 2015-12-27 DIAGNOSIS — R262 Difficulty in walking, not elsewhere classified: Secondary | ICD-10-CM | POA: Diagnosis not present

## 2015-12-28 DIAGNOSIS — M175 Other unilateral secondary osteoarthritis of knee: Secondary | ICD-10-CM | POA: Diagnosis not present

## 2015-12-28 DIAGNOSIS — F0150 Vascular dementia without behavioral disturbance: Secondary | ICD-10-CM | POA: Diagnosis not present

## 2015-12-28 DIAGNOSIS — R41841 Cognitive communication deficit: Secondary | ICD-10-CM | POA: Diagnosis not present

## 2015-12-28 DIAGNOSIS — R1319 Other dysphagia: Secondary | ICD-10-CM | POA: Diagnosis not present

## 2015-12-28 DIAGNOSIS — R262 Difficulty in walking, not elsewhere classified: Secondary | ICD-10-CM | POA: Diagnosis not present

## 2015-12-28 DIAGNOSIS — M6281 Muscle weakness (generalized): Secondary | ICD-10-CM | POA: Diagnosis not present

## 2015-12-28 DIAGNOSIS — M25562 Pain in left knee: Secondary | ICD-10-CM | POA: Diagnosis not present

## 2015-12-29 DIAGNOSIS — R41841 Cognitive communication deficit: Secondary | ICD-10-CM | POA: Diagnosis not present

## 2015-12-29 DIAGNOSIS — M6281 Muscle weakness (generalized): Secondary | ICD-10-CM | POA: Diagnosis not present

## 2015-12-29 DIAGNOSIS — R262 Difficulty in walking, not elsewhere classified: Secondary | ICD-10-CM | POA: Diagnosis not present

## 2015-12-29 DIAGNOSIS — R1319 Other dysphagia: Secondary | ICD-10-CM | POA: Diagnosis not present

## 2015-12-29 DIAGNOSIS — M25562 Pain in left knee: Secondary | ICD-10-CM | POA: Diagnosis not present

## 2015-12-29 DIAGNOSIS — M175 Other unilateral secondary osteoarthritis of knee: Secondary | ICD-10-CM | POA: Diagnosis not present

## 2015-12-30 DIAGNOSIS — R262 Difficulty in walking, not elsewhere classified: Secondary | ICD-10-CM | POA: Diagnosis not present

## 2015-12-30 DIAGNOSIS — M6281 Muscle weakness (generalized): Secondary | ICD-10-CM | POA: Diagnosis not present

## 2015-12-30 DIAGNOSIS — M175 Other unilateral secondary osteoarthritis of knee: Secondary | ICD-10-CM | POA: Diagnosis not present

## 2015-12-30 DIAGNOSIS — R41841 Cognitive communication deficit: Secondary | ICD-10-CM | POA: Diagnosis not present

## 2015-12-30 DIAGNOSIS — R1319 Other dysphagia: Secondary | ICD-10-CM | POA: Diagnosis not present

## 2015-12-30 DIAGNOSIS — M25562 Pain in left knee: Secondary | ICD-10-CM | POA: Diagnosis not present

## 2016-01-02 DIAGNOSIS — M25562 Pain in left knee: Secondary | ICD-10-CM | POA: Diagnosis not present

## 2016-01-02 DIAGNOSIS — M175 Other unilateral secondary osteoarthritis of knee: Secondary | ICD-10-CM | POA: Diagnosis not present

## 2016-01-02 DIAGNOSIS — R262 Difficulty in walking, not elsewhere classified: Secondary | ICD-10-CM | POA: Diagnosis not present

## 2016-01-02 DIAGNOSIS — M6281 Muscle weakness (generalized): Secondary | ICD-10-CM | POA: Diagnosis not present

## 2016-01-02 DIAGNOSIS — R1319 Other dysphagia: Secondary | ICD-10-CM | POA: Diagnosis not present

## 2016-01-02 DIAGNOSIS — R41841 Cognitive communication deficit: Secondary | ICD-10-CM | POA: Diagnosis not present

## 2016-01-03 DIAGNOSIS — M6281 Muscle weakness (generalized): Secondary | ICD-10-CM | POA: Diagnosis not present

## 2016-01-03 DIAGNOSIS — R41841 Cognitive communication deficit: Secondary | ICD-10-CM | POA: Diagnosis not present

## 2016-01-03 DIAGNOSIS — R262 Difficulty in walking, not elsewhere classified: Secondary | ICD-10-CM | POA: Diagnosis not present

## 2016-01-03 DIAGNOSIS — M25562 Pain in left knee: Secondary | ICD-10-CM | POA: Diagnosis not present

## 2016-01-03 DIAGNOSIS — M175 Other unilateral secondary osteoarthritis of knee: Secondary | ICD-10-CM | POA: Diagnosis not present

## 2016-01-03 DIAGNOSIS — R1319 Other dysphagia: Secondary | ICD-10-CM | POA: Diagnosis not present

## 2016-01-04 DIAGNOSIS — M175 Other unilateral secondary osteoarthritis of knee: Secondary | ICD-10-CM | POA: Diagnosis not present

## 2016-01-04 DIAGNOSIS — R41841 Cognitive communication deficit: Secondary | ICD-10-CM | POA: Diagnosis not present

## 2016-01-04 DIAGNOSIS — R1319 Other dysphagia: Secondary | ICD-10-CM | POA: Diagnosis not present

## 2016-01-04 DIAGNOSIS — M6281 Muscle weakness (generalized): Secondary | ICD-10-CM | POA: Diagnosis not present

## 2016-01-04 DIAGNOSIS — M25562 Pain in left knee: Secondary | ICD-10-CM | POA: Diagnosis not present

## 2016-01-04 DIAGNOSIS — R262 Difficulty in walking, not elsewhere classified: Secondary | ICD-10-CM | POA: Diagnosis not present

## 2016-01-05 DIAGNOSIS — R1319 Other dysphagia: Secondary | ICD-10-CM | POA: Diagnosis not present

## 2016-01-05 DIAGNOSIS — M6281 Muscle weakness (generalized): Secondary | ICD-10-CM | POA: Diagnosis not present

## 2016-01-05 DIAGNOSIS — M175 Other unilateral secondary osteoarthritis of knee: Secondary | ICD-10-CM | POA: Diagnosis not present

## 2016-01-05 DIAGNOSIS — R41841 Cognitive communication deficit: Secondary | ICD-10-CM | POA: Diagnosis not present

## 2016-01-05 DIAGNOSIS — R262 Difficulty in walking, not elsewhere classified: Secondary | ICD-10-CM | POA: Diagnosis not present

## 2016-01-05 DIAGNOSIS — M25562 Pain in left knee: Secondary | ICD-10-CM | POA: Diagnosis not present

## 2016-01-06 DIAGNOSIS — R262 Difficulty in walking, not elsewhere classified: Secondary | ICD-10-CM | POA: Diagnosis not present

## 2016-01-06 DIAGNOSIS — R41841 Cognitive communication deficit: Secondary | ICD-10-CM | POA: Diagnosis not present

## 2016-01-06 DIAGNOSIS — M6281 Muscle weakness (generalized): Secondary | ICD-10-CM | POA: Diagnosis not present

## 2016-01-06 DIAGNOSIS — M25562 Pain in left knee: Secondary | ICD-10-CM | POA: Diagnosis not present

## 2016-01-06 DIAGNOSIS — M175 Other unilateral secondary osteoarthritis of knee: Secondary | ICD-10-CM | POA: Diagnosis not present

## 2016-01-06 DIAGNOSIS — R1319 Other dysphagia: Secondary | ICD-10-CM | POA: Diagnosis not present

## 2016-01-09 DIAGNOSIS — R41841 Cognitive communication deficit: Secondary | ICD-10-CM | POA: Diagnosis not present

## 2016-01-09 DIAGNOSIS — M6281 Muscle weakness (generalized): Secondary | ICD-10-CM | POA: Diagnosis not present

## 2016-01-09 DIAGNOSIS — R262 Difficulty in walking, not elsewhere classified: Secondary | ICD-10-CM | POA: Diagnosis not present

## 2016-01-09 DIAGNOSIS — M175 Other unilateral secondary osteoarthritis of knee: Secondary | ICD-10-CM | POA: Diagnosis not present

## 2016-01-09 DIAGNOSIS — R1319 Other dysphagia: Secondary | ICD-10-CM | POA: Diagnosis not present

## 2016-01-09 DIAGNOSIS — M25562 Pain in left knee: Secondary | ICD-10-CM | POA: Diagnosis not present

## 2016-01-10 DIAGNOSIS — M175 Other unilateral secondary osteoarthritis of knee: Secondary | ICD-10-CM | POA: Diagnosis not present

## 2016-01-10 DIAGNOSIS — R262 Difficulty in walking, not elsewhere classified: Secondary | ICD-10-CM | POA: Diagnosis not present

## 2016-01-10 DIAGNOSIS — R41841 Cognitive communication deficit: Secondary | ICD-10-CM | POA: Diagnosis not present

## 2016-01-10 DIAGNOSIS — M6281 Muscle weakness (generalized): Secondary | ICD-10-CM | POA: Diagnosis not present

## 2016-01-10 DIAGNOSIS — R1319 Other dysphagia: Secondary | ICD-10-CM | POA: Diagnosis not present

## 2016-01-10 DIAGNOSIS — M25562 Pain in left knee: Secondary | ICD-10-CM | POA: Diagnosis not present

## 2016-01-11 DIAGNOSIS — M6281 Muscle weakness (generalized): Secondary | ICD-10-CM | POA: Diagnosis not present

## 2016-01-11 DIAGNOSIS — R1319 Other dysphagia: Secondary | ICD-10-CM | POA: Diagnosis not present

## 2016-01-11 DIAGNOSIS — M175 Other unilateral secondary osteoarthritis of knee: Secondary | ICD-10-CM | POA: Diagnosis not present

## 2016-01-11 DIAGNOSIS — E119 Type 2 diabetes mellitus without complications: Secondary | ICD-10-CM | POA: Diagnosis not present

## 2016-01-11 DIAGNOSIS — R41841 Cognitive communication deficit: Secondary | ICD-10-CM | POA: Diagnosis not present

## 2016-01-11 DIAGNOSIS — M25562 Pain in left knee: Secondary | ICD-10-CM | POA: Diagnosis not present

## 2016-01-11 DIAGNOSIS — I10 Essential (primary) hypertension: Secondary | ICD-10-CM | POA: Diagnosis not present

## 2016-01-11 DIAGNOSIS — R262 Difficulty in walking, not elsewhere classified: Secondary | ICD-10-CM | POA: Diagnosis not present

## 2016-01-11 DIAGNOSIS — E785 Hyperlipidemia, unspecified: Secondary | ICD-10-CM | POA: Diagnosis not present

## 2016-01-12 DIAGNOSIS — R262 Difficulty in walking, not elsewhere classified: Secondary | ICD-10-CM | POA: Diagnosis not present

## 2016-01-12 DIAGNOSIS — M175 Other unilateral secondary osteoarthritis of knee: Secondary | ICD-10-CM | POA: Diagnosis not present

## 2016-01-12 DIAGNOSIS — R1319 Other dysphagia: Secondary | ICD-10-CM | POA: Diagnosis not present

## 2016-01-12 DIAGNOSIS — M6281 Muscle weakness (generalized): Secondary | ICD-10-CM | POA: Diagnosis not present

## 2016-01-12 DIAGNOSIS — M25562 Pain in left knee: Secondary | ICD-10-CM | POA: Diagnosis not present

## 2016-01-12 DIAGNOSIS — R41841 Cognitive communication deficit: Secondary | ICD-10-CM | POA: Diagnosis not present

## 2016-01-13 DIAGNOSIS — R41841 Cognitive communication deficit: Secondary | ICD-10-CM | POA: Diagnosis not present

## 2016-01-13 DIAGNOSIS — R1319 Other dysphagia: Secondary | ICD-10-CM | POA: Diagnosis not present

## 2016-01-13 DIAGNOSIS — M25562 Pain in left knee: Secondary | ICD-10-CM | POA: Diagnosis not present

## 2016-01-13 DIAGNOSIS — M6281 Muscle weakness (generalized): Secondary | ICD-10-CM | POA: Diagnosis not present

## 2016-01-13 DIAGNOSIS — R262 Difficulty in walking, not elsewhere classified: Secondary | ICD-10-CM | POA: Diagnosis not present

## 2016-01-13 DIAGNOSIS — M175 Other unilateral secondary osteoarthritis of knee: Secondary | ICD-10-CM | POA: Diagnosis not present

## 2016-01-14 DIAGNOSIS — M6281 Muscle weakness (generalized): Secondary | ICD-10-CM | POA: Diagnosis not present

## 2016-01-14 DIAGNOSIS — R41841 Cognitive communication deficit: Secondary | ICD-10-CM | POA: Diagnosis not present

## 2016-01-14 DIAGNOSIS — M175 Other unilateral secondary osteoarthritis of knee: Secondary | ICD-10-CM | POA: Diagnosis not present

## 2016-01-14 DIAGNOSIS — R1319 Other dysphagia: Secondary | ICD-10-CM | POA: Diagnosis not present

## 2016-01-14 DIAGNOSIS — M25562 Pain in left knee: Secondary | ICD-10-CM | POA: Diagnosis not present

## 2016-01-14 DIAGNOSIS — R262 Difficulty in walking, not elsewhere classified: Secondary | ICD-10-CM | POA: Diagnosis not present

## 2016-01-15 DIAGNOSIS — R41841 Cognitive communication deficit: Secondary | ICD-10-CM | POA: Diagnosis not present

## 2016-01-15 DIAGNOSIS — M25562 Pain in left knee: Secondary | ICD-10-CM | POA: Diagnosis not present

## 2016-01-15 DIAGNOSIS — R1319 Other dysphagia: Secondary | ICD-10-CM | POA: Diagnosis not present

## 2016-01-15 DIAGNOSIS — R262 Difficulty in walking, not elsewhere classified: Secondary | ICD-10-CM | POA: Diagnosis not present

## 2016-01-15 DIAGNOSIS — M6281 Muscle weakness (generalized): Secondary | ICD-10-CM | POA: Diagnosis not present

## 2016-01-15 DIAGNOSIS — M175 Other unilateral secondary osteoarthritis of knee: Secondary | ICD-10-CM | POA: Diagnosis not present

## 2016-01-16 DIAGNOSIS — M6281 Muscle weakness (generalized): Secondary | ICD-10-CM | POA: Diagnosis not present

## 2016-01-16 DIAGNOSIS — R262 Difficulty in walking, not elsewhere classified: Secondary | ICD-10-CM | POA: Diagnosis not present

## 2016-01-16 DIAGNOSIS — M25562 Pain in left knee: Secondary | ICD-10-CM | POA: Diagnosis not present

## 2016-01-16 DIAGNOSIS — R1319 Other dysphagia: Secondary | ICD-10-CM | POA: Diagnosis not present

## 2016-01-16 DIAGNOSIS — M175 Other unilateral secondary osteoarthritis of knee: Secondary | ICD-10-CM | POA: Diagnosis not present

## 2016-01-16 DIAGNOSIS — R41841 Cognitive communication deficit: Secondary | ICD-10-CM | POA: Diagnosis not present

## 2016-01-17 DIAGNOSIS — M25562 Pain in left knee: Secondary | ICD-10-CM | POA: Diagnosis not present

## 2016-01-17 DIAGNOSIS — R262 Difficulty in walking, not elsewhere classified: Secondary | ICD-10-CM | POA: Diagnosis not present

## 2016-01-17 DIAGNOSIS — R41841 Cognitive communication deficit: Secondary | ICD-10-CM | POA: Diagnosis not present

## 2016-01-17 DIAGNOSIS — M175 Other unilateral secondary osteoarthritis of knee: Secondary | ICD-10-CM | POA: Diagnosis not present

## 2016-01-17 DIAGNOSIS — R1319 Other dysphagia: Secondary | ICD-10-CM | POA: Diagnosis not present

## 2016-01-17 DIAGNOSIS — M6281 Muscle weakness (generalized): Secondary | ICD-10-CM | POA: Diagnosis not present

## 2016-01-18 DIAGNOSIS — R1319 Other dysphagia: Secondary | ICD-10-CM | POA: Diagnosis not present

## 2016-01-18 DIAGNOSIS — M25562 Pain in left knee: Secondary | ICD-10-CM | POA: Diagnosis not present

## 2016-01-18 DIAGNOSIS — M6281 Muscle weakness (generalized): Secondary | ICD-10-CM | POA: Diagnosis not present

## 2016-01-18 DIAGNOSIS — M175 Other unilateral secondary osteoarthritis of knee: Secondary | ICD-10-CM | POA: Diagnosis not present

## 2016-01-18 DIAGNOSIS — R262 Difficulty in walking, not elsewhere classified: Secondary | ICD-10-CM | POA: Diagnosis not present

## 2016-01-18 DIAGNOSIS — R41841 Cognitive communication deficit: Secondary | ICD-10-CM | POA: Diagnosis not present

## 2016-01-19 DIAGNOSIS — M175 Other unilateral secondary osteoarthritis of knee: Secondary | ICD-10-CM | POA: Diagnosis not present

## 2016-01-19 DIAGNOSIS — M6281 Muscle weakness (generalized): Secondary | ICD-10-CM | POA: Diagnosis not present

## 2016-01-19 DIAGNOSIS — R1319 Other dysphagia: Secondary | ICD-10-CM | POA: Diagnosis not present

## 2016-01-19 DIAGNOSIS — M25562 Pain in left knee: Secondary | ICD-10-CM | POA: Diagnosis not present

## 2016-01-19 DIAGNOSIS — R262 Difficulty in walking, not elsewhere classified: Secondary | ICD-10-CM | POA: Diagnosis not present

## 2016-01-19 DIAGNOSIS — R41841 Cognitive communication deficit: Secondary | ICD-10-CM | POA: Diagnosis not present

## 2016-01-20 DIAGNOSIS — M175 Other unilateral secondary osteoarthritis of knee: Secondary | ICD-10-CM | POA: Diagnosis not present

## 2016-01-20 DIAGNOSIS — R41841 Cognitive communication deficit: Secondary | ICD-10-CM | POA: Diagnosis not present

## 2016-01-20 DIAGNOSIS — R1319 Other dysphagia: Secondary | ICD-10-CM | POA: Diagnosis not present

## 2016-01-20 DIAGNOSIS — M6281 Muscle weakness (generalized): Secondary | ICD-10-CM | POA: Diagnosis not present

## 2016-01-20 DIAGNOSIS — M25562 Pain in left knee: Secondary | ICD-10-CM | POA: Diagnosis not present

## 2016-01-20 DIAGNOSIS — R262 Difficulty in walking, not elsewhere classified: Secondary | ICD-10-CM | POA: Diagnosis not present

## 2016-01-23 DIAGNOSIS — M175 Other unilateral secondary osteoarthritis of knee: Secondary | ICD-10-CM | POA: Diagnosis not present

## 2016-01-23 DIAGNOSIS — M6281 Muscle weakness (generalized): Secondary | ICD-10-CM | POA: Diagnosis not present

## 2016-01-23 DIAGNOSIS — R262 Difficulty in walking, not elsewhere classified: Secondary | ICD-10-CM | POA: Diagnosis not present

## 2016-01-23 DIAGNOSIS — R41841 Cognitive communication deficit: Secondary | ICD-10-CM | POA: Diagnosis not present

## 2016-01-23 DIAGNOSIS — M25562 Pain in left knee: Secondary | ICD-10-CM | POA: Diagnosis not present

## 2016-01-23 DIAGNOSIS — R1319 Other dysphagia: Secondary | ICD-10-CM | POA: Diagnosis not present

## 2016-01-24 DIAGNOSIS — R262 Difficulty in walking, not elsewhere classified: Secondary | ICD-10-CM | POA: Diagnosis not present

## 2016-01-24 DIAGNOSIS — R1319 Other dysphagia: Secondary | ICD-10-CM | POA: Diagnosis not present

## 2016-01-24 DIAGNOSIS — M6281 Muscle weakness (generalized): Secondary | ICD-10-CM | POA: Diagnosis not present

## 2016-01-24 DIAGNOSIS — M175 Other unilateral secondary osteoarthritis of knee: Secondary | ICD-10-CM | POA: Diagnosis not present

## 2016-01-24 DIAGNOSIS — M25562 Pain in left knee: Secondary | ICD-10-CM | POA: Diagnosis not present

## 2016-01-24 DIAGNOSIS — R41841 Cognitive communication deficit: Secondary | ICD-10-CM | POA: Diagnosis not present

## 2016-01-25 DIAGNOSIS — M25562 Pain in left knee: Secondary | ICD-10-CM | POA: Diagnosis not present

## 2016-01-25 DIAGNOSIS — R262 Difficulty in walking, not elsewhere classified: Secondary | ICD-10-CM | POA: Diagnosis not present

## 2016-01-25 DIAGNOSIS — M6281 Muscle weakness (generalized): Secondary | ICD-10-CM | POA: Diagnosis not present

## 2016-01-25 DIAGNOSIS — F0390 Unspecified dementia without behavioral disturbance: Secondary | ICD-10-CM | POA: Diagnosis not present

## 2016-01-25 DIAGNOSIS — M175 Other unilateral secondary osteoarthritis of knee: Secondary | ICD-10-CM | POA: Diagnosis not present

## 2016-01-25 DIAGNOSIS — I10 Essential (primary) hypertension: Secondary | ICD-10-CM | POA: Diagnosis not present

## 2016-01-25 DIAGNOSIS — R41841 Cognitive communication deficit: Secondary | ICD-10-CM | POA: Diagnosis not present

## 2016-01-25 DIAGNOSIS — E119 Type 2 diabetes mellitus without complications: Secondary | ICD-10-CM | POA: Diagnosis not present

## 2016-01-25 DIAGNOSIS — R1319 Other dysphagia: Secondary | ICD-10-CM | POA: Diagnosis not present

## 2016-01-26 DIAGNOSIS — R41841 Cognitive communication deficit: Secondary | ICD-10-CM | POA: Diagnosis not present

## 2016-01-26 DIAGNOSIS — R262 Difficulty in walking, not elsewhere classified: Secondary | ICD-10-CM | POA: Diagnosis not present

## 2016-01-26 DIAGNOSIS — M175 Other unilateral secondary osteoarthritis of knee: Secondary | ICD-10-CM | POA: Diagnosis not present

## 2016-01-26 DIAGNOSIS — R1319 Other dysphagia: Secondary | ICD-10-CM | POA: Diagnosis not present

## 2016-01-26 DIAGNOSIS — M25562 Pain in left knee: Secondary | ICD-10-CM | POA: Diagnosis not present

## 2016-01-26 DIAGNOSIS — M6281 Muscle weakness (generalized): Secondary | ICD-10-CM | POA: Diagnosis not present

## 2016-01-27 DIAGNOSIS — R262 Difficulty in walking, not elsewhere classified: Secondary | ICD-10-CM | POA: Diagnosis not present

## 2016-01-27 DIAGNOSIS — R1319 Other dysphagia: Secondary | ICD-10-CM | POA: Diagnosis not present

## 2016-01-27 DIAGNOSIS — R41841 Cognitive communication deficit: Secondary | ICD-10-CM | POA: Diagnosis not present

## 2016-01-27 DIAGNOSIS — M25562 Pain in left knee: Secondary | ICD-10-CM | POA: Diagnosis not present

## 2016-01-27 DIAGNOSIS — M6281 Muscle weakness (generalized): Secondary | ICD-10-CM | POA: Diagnosis not present

## 2016-01-27 DIAGNOSIS — M175 Other unilateral secondary osteoarthritis of knee: Secondary | ICD-10-CM | POA: Diagnosis not present

## 2016-01-30 DIAGNOSIS — M6281 Muscle weakness (generalized): Secondary | ICD-10-CM | POA: Diagnosis not present

## 2016-01-30 DIAGNOSIS — M175 Other unilateral secondary osteoarthritis of knee: Secondary | ICD-10-CM | POA: Diagnosis not present

## 2016-01-30 DIAGNOSIS — R41841 Cognitive communication deficit: Secondary | ICD-10-CM | POA: Diagnosis not present

## 2016-01-30 DIAGNOSIS — R1319 Other dysphagia: Secondary | ICD-10-CM | POA: Diagnosis not present

## 2016-01-30 DIAGNOSIS — M25562 Pain in left knee: Secondary | ICD-10-CM | POA: Diagnosis not present

## 2016-01-30 DIAGNOSIS — R262 Difficulty in walking, not elsewhere classified: Secondary | ICD-10-CM | POA: Diagnosis not present

## 2016-01-31 DIAGNOSIS — R41841 Cognitive communication deficit: Secondary | ICD-10-CM | POA: Diagnosis not present

## 2016-01-31 DIAGNOSIS — R262 Difficulty in walking, not elsewhere classified: Secondary | ICD-10-CM | POA: Diagnosis not present

## 2016-01-31 DIAGNOSIS — M175 Other unilateral secondary osteoarthritis of knee: Secondary | ICD-10-CM | POA: Diagnosis not present

## 2016-01-31 DIAGNOSIS — R1319 Other dysphagia: Secondary | ICD-10-CM | POA: Diagnosis not present

## 2016-01-31 DIAGNOSIS — M25562 Pain in left knee: Secondary | ICD-10-CM | POA: Diagnosis not present

## 2016-01-31 DIAGNOSIS — M6281 Muscle weakness (generalized): Secondary | ICD-10-CM | POA: Diagnosis not present

## 2016-02-01 DIAGNOSIS — M6281 Muscle weakness (generalized): Secondary | ICD-10-CM | POA: Diagnosis not present

## 2016-02-01 DIAGNOSIS — M25562 Pain in left knee: Secondary | ICD-10-CM | POA: Diagnosis not present

## 2016-02-01 DIAGNOSIS — R1319 Other dysphagia: Secondary | ICD-10-CM | POA: Diagnosis not present

## 2016-02-01 DIAGNOSIS — R262 Difficulty in walking, not elsewhere classified: Secondary | ICD-10-CM | POA: Diagnosis not present

## 2016-02-01 DIAGNOSIS — R41841 Cognitive communication deficit: Secondary | ICD-10-CM | POA: Diagnosis not present

## 2016-02-01 DIAGNOSIS — M175 Other unilateral secondary osteoarthritis of knee: Secondary | ICD-10-CM | POA: Diagnosis not present

## 2016-02-02 DIAGNOSIS — M25562 Pain in left knee: Secondary | ICD-10-CM | POA: Diagnosis not present

## 2016-02-02 DIAGNOSIS — R41841 Cognitive communication deficit: Secondary | ICD-10-CM | POA: Diagnosis not present

## 2016-02-02 DIAGNOSIS — R262 Difficulty in walking, not elsewhere classified: Secondary | ICD-10-CM | POA: Diagnosis not present

## 2016-02-02 DIAGNOSIS — R1319 Other dysphagia: Secondary | ICD-10-CM | POA: Diagnosis not present

## 2016-02-02 DIAGNOSIS — M6281 Muscle weakness (generalized): Secondary | ICD-10-CM | POA: Diagnosis not present

## 2016-02-02 DIAGNOSIS — M175 Other unilateral secondary osteoarthritis of knee: Secondary | ICD-10-CM | POA: Diagnosis not present

## 2016-02-03 DIAGNOSIS — M6281 Muscle weakness (generalized): Secondary | ICD-10-CM | POA: Diagnosis not present

## 2016-02-03 DIAGNOSIS — M25562 Pain in left knee: Secondary | ICD-10-CM | POA: Diagnosis not present

## 2016-02-03 DIAGNOSIS — R1319 Other dysphagia: Secondary | ICD-10-CM | POA: Diagnosis not present

## 2016-02-03 DIAGNOSIS — R262 Difficulty in walking, not elsewhere classified: Secondary | ICD-10-CM | POA: Diagnosis not present

## 2016-02-03 DIAGNOSIS — M175 Other unilateral secondary osteoarthritis of knee: Secondary | ICD-10-CM | POA: Diagnosis not present

## 2016-02-03 DIAGNOSIS — R41841 Cognitive communication deficit: Secondary | ICD-10-CM | POA: Diagnosis not present

## 2016-02-06 DIAGNOSIS — R41841 Cognitive communication deficit: Secondary | ICD-10-CM | POA: Diagnosis not present

## 2016-02-06 DIAGNOSIS — R262 Difficulty in walking, not elsewhere classified: Secondary | ICD-10-CM | POA: Diagnosis not present

## 2016-02-06 DIAGNOSIS — R1319 Other dysphagia: Secondary | ICD-10-CM | POA: Diagnosis not present

## 2016-02-06 DIAGNOSIS — M25562 Pain in left knee: Secondary | ICD-10-CM | POA: Diagnosis not present

## 2016-02-06 DIAGNOSIS — M6281 Muscle weakness (generalized): Secondary | ICD-10-CM | POA: Diagnosis not present

## 2016-02-06 DIAGNOSIS — M175 Other unilateral secondary osteoarthritis of knee: Secondary | ICD-10-CM | POA: Diagnosis not present

## 2016-02-07 DIAGNOSIS — M25552 Pain in left hip: Secondary | ICD-10-CM | POA: Diagnosis not present

## 2016-02-07 DIAGNOSIS — R41841 Cognitive communication deficit: Secondary | ICD-10-CM | POA: Diagnosis not present

## 2016-02-07 DIAGNOSIS — R1319 Other dysphagia: Secondary | ICD-10-CM | POA: Diagnosis not present

## 2016-02-07 DIAGNOSIS — R262 Difficulty in walking, not elsewhere classified: Secondary | ICD-10-CM | POA: Diagnosis not present

## 2016-02-07 DIAGNOSIS — M25562 Pain in left knee: Secondary | ICD-10-CM | POA: Diagnosis not present

## 2016-02-07 DIAGNOSIS — M175 Other unilateral secondary osteoarthritis of knee: Secondary | ICD-10-CM | POA: Diagnosis not present

## 2016-02-07 DIAGNOSIS — M6281 Muscle weakness (generalized): Secondary | ICD-10-CM | POA: Diagnosis not present

## 2016-02-08 ENCOUNTER — Encounter: Payer: PRIVATE HEALTH INSURANCE | Admitting: Internal Medicine

## 2016-02-08 DIAGNOSIS — M6281 Muscle weakness (generalized): Secondary | ICD-10-CM | POA: Diagnosis not present

## 2016-02-08 DIAGNOSIS — R1319 Other dysphagia: Secondary | ICD-10-CM | POA: Diagnosis not present

## 2016-02-08 DIAGNOSIS — M25562 Pain in left knee: Secondary | ICD-10-CM | POA: Diagnosis not present

## 2016-02-08 DIAGNOSIS — R262 Difficulty in walking, not elsewhere classified: Secondary | ICD-10-CM | POA: Diagnosis not present

## 2016-02-08 DIAGNOSIS — R41841 Cognitive communication deficit: Secondary | ICD-10-CM | POA: Diagnosis not present

## 2016-02-08 DIAGNOSIS — M175 Other unilateral secondary osteoarthritis of knee: Secondary | ICD-10-CM | POA: Diagnosis not present

## 2016-02-09 DIAGNOSIS — R41841 Cognitive communication deficit: Secondary | ICD-10-CM | POA: Diagnosis not present

## 2016-02-09 DIAGNOSIS — R1319 Other dysphagia: Secondary | ICD-10-CM | POA: Diagnosis not present

## 2016-02-09 DIAGNOSIS — M6281 Muscle weakness (generalized): Secondary | ICD-10-CM | POA: Diagnosis not present

## 2016-02-09 DIAGNOSIS — R262 Difficulty in walking, not elsewhere classified: Secondary | ICD-10-CM | POA: Diagnosis not present

## 2016-02-09 DIAGNOSIS — M25562 Pain in left knee: Secondary | ICD-10-CM | POA: Diagnosis not present

## 2016-02-09 DIAGNOSIS — M175 Other unilateral secondary osteoarthritis of knee: Secondary | ICD-10-CM | POA: Diagnosis not present

## 2016-02-10 DIAGNOSIS — R1319 Other dysphagia: Secondary | ICD-10-CM | POA: Diagnosis not present

## 2016-02-10 DIAGNOSIS — R41841 Cognitive communication deficit: Secondary | ICD-10-CM | POA: Diagnosis not present

## 2016-02-10 DIAGNOSIS — M25562 Pain in left knee: Secondary | ICD-10-CM | POA: Diagnosis not present

## 2016-02-10 DIAGNOSIS — M6281 Muscle weakness (generalized): Secondary | ICD-10-CM | POA: Diagnosis not present

## 2016-02-10 DIAGNOSIS — M175 Other unilateral secondary osteoarthritis of knee: Secondary | ICD-10-CM | POA: Diagnosis not present

## 2016-02-10 DIAGNOSIS — R262 Difficulty in walking, not elsewhere classified: Secondary | ICD-10-CM | POA: Diagnosis not present

## 2016-02-13 DIAGNOSIS — E785 Hyperlipidemia, unspecified: Secondary | ICD-10-CM | POA: Diagnosis not present

## 2016-02-13 DIAGNOSIS — I10 Essential (primary) hypertension: Secondary | ICD-10-CM | POA: Diagnosis not present

## 2016-02-13 DIAGNOSIS — E119 Type 2 diabetes mellitus without complications: Secondary | ICD-10-CM | POA: Diagnosis not present

## 2016-02-13 DIAGNOSIS — F0150 Vascular dementia without behavioral disturbance: Secondary | ICD-10-CM | POA: Diagnosis not present

## 2016-02-13 DIAGNOSIS — R41841 Cognitive communication deficit: Secondary | ICD-10-CM | POA: Diagnosis not present

## 2016-02-14 DIAGNOSIS — I10 Essential (primary) hypertension: Secondary | ICD-10-CM | POA: Diagnosis not present

## 2016-02-14 DIAGNOSIS — E119 Type 2 diabetes mellitus without complications: Secondary | ICD-10-CM | POA: Diagnosis not present

## 2016-02-14 DIAGNOSIS — F0150 Vascular dementia without behavioral disturbance: Secondary | ICD-10-CM | POA: Diagnosis not present

## 2016-02-14 DIAGNOSIS — E785 Hyperlipidemia, unspecified: Secondary | ICD-10-CM | POA: Diagnosis not present

## 2016-02-14 DIAGNOSIS — R41841 Cognitive communication deficit: Secondary | ICD-10-CM | POA: Diagnosis not present

## 2016-02-15 DIAGNOSIS — E785 Hyperlipidemia, unspecified: Secondary | ICD-10-CM | POA: Diagnosis not present

## 2016-02-15 DIAGNOSIS — R41841 Cognitive communication deficit: Secondary | ICD-10-CM | POA: Diagnosis not present

## 2016-02-15 DIAGNOSIS — F0150 Vascular dementia without behavioral disturbance: Secondary | ICD-10-CM | POA: Diagnosis not present

## 2016-02-15 DIAGNOSIS — E119 Type 2 diabetes mellitus without complications: Secondary | ICD-10-CM | POA: Diagnosis not present

## 2016-02-15 DIAGNOSIS — I10 Essential (primary) hypertension: Secondary | ICD-10-CM | POA: Diagnosis not present

## 2016-02-16 DIAGNOSIS — I10 Essential (primary) hypertension: Secondary | ICD-10-CM | POA: Diagnosis not present

## 2016-02-16 DIAGNOSIS — E785 Hyperlipidemia, unspecified: Secondary | ICD-10-CM | POA: Diagnosis not present

## 2016-02-16 DIAGNOSIS — R41841 Cognitive communication deficit: Secondary | ICD-10-CM | POA: Diagnosis not present

## 2016-02-16 DIAGNOSIS — F0150 Vascular dementia without behavioral disturbance: Secondary | ICD-10-CM | POA: Diagnosis not present

## 2016-02-16 DIAGNOSIS — E119 Type 2 diabetes mellitus without complications: Secondary | ICD-10-CM | POA: Diagnosis not present

## 2016-02-18 DIAGNOSIS — E785 Hyperlipidemia, unspecified: Secondary | ICD-10-CM | POA: Diagnosis not present

## 2016-02-18 DIAGNOSIS — R41841 Cognitive communication deficit: Secondary | ICD-10-CM | POA: Diagnosis not present

## 2016-02-18 DIAGNOSIS — E119 Type 2 diabetes mellitus without complications: Secondary | ICD-10-CM | POA: Diagnosis not present

## 2016-02-18 DIAGNOSIS — I10 Essential (primary) hypertension: Secondary | ICD-10-CM | POA: Diagnosis not present

## 2016-02-18 DIAGNOSIS — F0150 Vascular dementia without behavioral disturbance: Secondary | ICD-10-CM | POA: Diagnosis not present

## 2016-02-20 DIAGNOSIS — R41841 Cognitive communication deficit: Secondary | ICD-10-CM | POA: Diagnosis not present

## 2016-02-20 DIAGNOSIS — I10 Essential (primary) hypertension: Secondary | ICD-10-CM | POA: Diagnosis not present

## 2016-02-20 DIAGNOSIS — E785 Hyperlipidemia, unspecified: Secondary | ICD-10-CM | POA: Diagnosis not present

## 2016-02-20 DIAGNOSIS — F0150 Vascular dementia without behavioral disturbance: Secondary | ICD-10-CM | POA: Diagnosis not present

## 2016-02-20 DIAGNOSIS — E119 Type 2 diabetes mellitus without complications: Secondary | ICD-10-CM | POA: Diagnosis not present

## 2016-02-21 DIAGNOSIS — E785 Hyperlipidemia, unspecified: Secondary | ICD-10-CM | POA: Diagnosis not present

## 2016-02-21 DIAGNOSIS — F0150 Vascular dementia without behavioral disturbance: Secondary | ICD-10-CM | POA: Diagnosis not present

## 2016-02-21 DIAGNOSIS — E119 Type 2 diabetes mellitus without complications: Secondary | ICD-10-CM | POA: Diagnosis not present

## 2016-02-21 DIAGNOSIS — I10 Essential (primary) hypertension: Secondary | ICD-10-CM | POA: Diagnosis not present

## 2016-02-21 DIAGNOSIS — R41841 Cognitive communication deficit: Secondary | ICD-10-CM | POA: Diagnosis not present

## 2016-02-22 DIAGNOSIS — I10 Essential (primary) hypertension: Secondary | ICD-10-CM | POA: Diagnosis not present

## 2016-02-22 DIAGNOSIS — E119 Type 2 diabetes mellitus without complications: Secondary | ICD-10-CM | POA: Diagnosis not present

## 2016-02-22 DIAGNOSIS — R41841 Cognitive communication deficit: Secondary | ICD-10-CM | POA: Diagnosis not present

## 2016-02-22 DIAGNOSIS — E785 Hyperlipidemia, unspecified: Secondary | ICD-10-CM | POA: Diagnosis not present

## 2016-02-22 DIAGNOSIS — F0390 Unspecified dementia without behavioral disturbance: Secondary | ICD-10-CM | POA: Diagnosis not present

## 2016-02-22 DIAGNOSIS — F0150 Vascular dementia without behavioral disturbance: Secondary | ICD-10-CM | POA: Diagnosis not present

## 2016-02-23 DIAGNOSIS — E785 Hyperlipidemia, unspecified: Secondary | ICD-10-CM | POA: Diagnosis not present

## 2016-02-23 DIAGNOSIS — I10 Essential (primary) hypertension: Secondary | ICD-10-CM | POA: Diagnosis not present

## 2016-02-23 DIAGNOSIS — R41841 Cognitive communication deficit: Secondary | ICD-10-CM | POA: Diagnosis not present

## 2016-02-23 DIAGNOSIS — E119 Type 2 diabetes mellitus without complications: Secondary | ICD-10-CM | POA: Diagnosis not present

## 2016-02-23 DIAGNOSIS — F0150 Vascular dementia without behavioral disturbance: Secondary | ICD-10-CM | POA: Diagnosis not present

## 2016-02-24 DIAGNOSIS — R41841 Cognitive communication deficit: Secondary | ICD-10-CM | POA: Diagnosis not present

## 2016-02-24 DIAGNOSIS — E785 Hyperlipidemia, unspecified: Secondary | ICD-10-CM | POA: Diagnosis not present

## 2016-02-24 DIAGNOSIS — I10 Essential (primary) hypertension: Secondary | ICD-10-CM | POA: Diagnosis not present

## 2016-02-24 DIAGNOSIS — E119 Type 2 diabetes mellitus without complications: Secondary | ICD-10-CM | POA: Diagnosis not present

## 2016-02-24 DIAGNOSIS — F0150 Vascular dementia without behavioral disturbance: Secondary | ICD-10-CM | POA: Diagnosis not present

## 2016-02-27 DIAGNOSIS — R41841 Cognitive communication deficit: Secondary | ICD-10-CM | POA: Diagnosis not present

## 2016-02-27 DIAGNOSIS — E785 Hyperlipidemia, unspecified: Secondary | ICD-10-CM | POA: Diagnosis not present

## 2016-02-27 DIAGNOSIS — F0150 Vascular dementia without behavioral disturbance: Secondary | ICD-10-CM | POA: Diagnosis not present

## 2016-02-27 DIAGNOSIS — E119 Type 2 diabetes mellitus without complications: Secondary | ICD-10-CM | POA: Diagnosis not present

## 2016-02-27 DIAGNOSIS — I10 Essential (primary) hypertension: Secondary | ICD-10-CM | POA: Diagnosis not present

## 2016-02-28 DIAGNOSIS — E785 Hyperlipidemia, unspecified: Secondary | ICD-10-CM | POA: Diagnosis not present

## 2016-02-28 DIAGNOSIS — R41841 Cognitive communication deficit: Secondary | ICD-10-CM | POA: Diagnosis not present

## 2016-02-28 DIAGNOSIS — E119 Type 2 diabetes mellitus without complications: Secondary | ICD-10-CM | POA: Diagnosis not present

## 2016-02-28 DIAGNOSIS — I10 Essential (primary) hypertension: Secondary | ICD-10-CM | POA: Diagnosis not present

## 2016-02-28 DIAGNOSIS — F0150 Vascular dementia without behavioral disturbance: Secondary | ICD-10-CM | POA: Diagnosis not present

## 2016-02-29 DIAGNOSIS — F0150 Vascular dementia without behavioral disturbance: Secondary | ICD-10-CM | POA: Diagnosis not present

## 2016-02-29 DIAGNOSIS — R41841 Cognitive communication deficit: Secondary | ICD-10-CM | POA: Diagnosis not present

## 2016-02-29 DIAGNOSIS — I10 Essential (primary) hypertension: Secondary | ICD-10-CM | POA: Diagnosis not present

## 2016-02-29 DIAGNOSIS — E785 Hyperlipidemia, unspecified: Secondary | ICD-10-CM | POA: Diagnosis not present

## 2016-02-29 DIAGNOSIS — E119 Type 2 diabetes mellitus without complications: Secondary | ICD-10-CM | POA: Diagnosis not present

## 2016-03-01 DIAGNOSIS — I10 Essential (primary) hypertension: Secondary | ICD-10-CM | POA: Diagnosis not present

## 2016-03-01 DIAGNOSIS — E785 Hyperlipidemia, unspecified: Secondary | ICD-10-CM | POA: Diagnosis not present

## 2016-03-01 DIAGNOSIS — E119 Type 2 diabetes mellitus without complications: Secondary | ICD-10-CM | POA: Diagnosis not present

## 2016-03-01 DIAGNOSIS — R41841 Cognitive communication deficit: Secondary | ICD-10-CM | POA: Diagnosis not present

## 2016-03-01 DIAGNOSIS — F0150 Vascular dementia without behavioral disturbance: Secondary | ICD-10-CM | POA: Diagnosis not present

## 2016-03-02 DIAGNOSIS — R41841 Cognitive communication deficit: Secondary | ICD-10-CM | POA: Diagnosis not present

## 2016-03-02 DIAGNOSIS — E785 Hyperlipidemia, unspecified: Secondary | ICD-10-CM | POA: Diagnosis not present

## 2016-03-02 DIAGNOSIS — E119 Type 2 diabetes mellitus without complications: Secondary | ICD-10-CM | POA: Diagnosis not present

## 2016-03-02 DIAGNOSIS — I10 Essential (primary) hypertension: Secondary | ICD-10-CM | POA: Diagnosis not present

## 2016-03-02 DIAGNOSIS — F0150 Vascular dementia without behavioral disturbance: Secondary | ICD-10-CM | POA: Diagnosis not present

## 2016-03-21 DIAGNOSIS — R41841 Cognitive communication deficit: Secondary | ICD-10-CM | POA: Diagnosis not present

## 2016-03-21 DIAGNOSIS — R1312 Dysphagia, oropharyngeal phase: Secondary | ICD-10-CM | POA: Diagnosis not present

## 2016-03-22 DIAGNOSIS — R1312 Dysphagia, oropharyngeal phase: Secondary | ICD-10-CM | POA: Diagnosis not present

## 2016-03-22 DIAGNOSIS — R41841 Cognitive communication deficit: Secondary | ICD-10-CM | POA: Diagnosis not present

## 2016-03-23 DIAGNOSIS — R41841 Cognitive communication deficit: Secondary | ICD-10-CM | POA: Diagnosis not present

## 2016-03-23 DIAGNOSIS — R1312 Dysphagia, oropharyngeal phase: Secondary | ICD-10-CM | POA: Diagnosis not present

## 2016-03-26 DIAGNOSIS — R41841 Cognitive communication deficit: Secondary | ICD-10-CM | POA: Diagnosis not present

## 2016-03-26 DIAGNOSIS — R1312 Dysphagia, oropharyngeal phase: Secondary | ICD-10-CM | POA: Diagnosis not present

## 2016-03-27 DIAGNOSIS — R1312 Dysphagia, oropharyngeal phase: Secondary | ICD-10-CM | POA: Diagnosis not present

## 2016-03-27 DIAGNOSIS — R41841 Cognitive communication deficit: Secondary | ICD-10-CM | POA: Diagnosis not present

## 2016-03-28 DIAGNOSIS — R1312 Dysphagia, oropharyngeal phase: Secondary | ICD-10-CM | POA: Diagnosis not present

## 2016-03-28 DIAGNOSIS — R41841 Cognitive communication deficit: Secondary | ICD-10-CM | POA: Diagnosis not present

## 2016-03-29 DIAGNOSIS — R1312 Dysphagia, oropharyngeal phase: Secondary | ICD-10-CM | POA: Diagnosis not present

## 2016-03-29 DIAGNOSIS — R41841 Cognitive communication deficit: Secondary | ICD-10-CM | POA: Diagnosis not present

## 2016-03-30 DIAGNOSIS — R1312 Dysphagia, oropharyngeal phase: Secondary | ICD-10-CM | POA: Diagnosis not present

## 2016-03-30 DIAGNOSIS — R41841 Cognitive communication deficit: Secondary | ICD-10-CM | POA: Diagnosis not present

## 2016-04-02 DIAGNOSIS — R1312 Dysphagia, oropharyngeal phase: Secondary | ICD-10-CM | POA: Diagnosis not present

## 2016-04-02 DIAGNOSIS — R41841 Cognitive communication deficit: Secondary | ICD-10-CM | POA: Diagnosis not present

## 2016-04-03 DIAGNOSIS — R1312 Dysphagia, oropharyngeal phase: Secondary | ICD-10-CM | POA: Diagnosis not present

## 2016-04-03 DIAGNOSIS — R41841 Cognitive communication deficit: Secondary | ICD-10-CM | POA: Diagnosis not present

## 2016-04-04 DIAGNOSIS — R1312 Dysphagia, oropharyngeal phase: Secondary | ICD-10-CM | POA: Diagnosis not present

## 2016-04-04 DIAGNOSIS — R41841 Cognitive communication deficit: Secondary | ICD-10-CM | POA: Diagnosis not present

## 2016-04-05 DIAGNOSIS — R41841 Cognitive communication deficit: Secondary | ICD-10-CM | POA: Diagnosis not present

## 2016-04-05 DIAGNOSIS — R1312 Dysphagia, oropharyngeal phase: Secondary | ICD-10-CM | POA: Diagnosis not present

## 2016-04-06 DIAGNOSIS — R41841 Cognitive communication deficit: Secondary | ICD-10-CM | POA: Diagnosis not present

## 2016-04-06 DIAGNOSIS — R1312 Dysphagia, oropharyngeal phase: Secondary | ICD-10-CM | POA: Diagnosis not present

## 2016-04-09 DIAGNOSIS — R41841 Cognitive communication deficit: Secondary | ICD-10-CM | POA: Diagnosis not present

## 2016-04-09 DIAGNOSIS — R1312 Dysphagia, oropharyngeal phase: Secondary | ICD-10-CM | POA: Diagnosis not present

## 2016-04-10 DIAGNOSIS — R41841 Cognitive communication deficit: Secondary | ICD-10-CM | POA: Diagnosis not present

## 2016-04-10 DIAGNOSIS — R1312 Dysphagia, oropharyngeal phase: Secondary | ICD-10-CM | POA: Diagnosis not present

## 2016-04-11 DIAGNOSIS — R41841 Cognitive communication deficit: Secondary | ICD-10-CM | POA: Diagnosis not present

## 2016-04-11 DIAGNOSIS — R1312 Dysphagia, oropharyngeal phase: Secondary | ICD-10-CM | POA: Diagnosis not present

## 2016-04-12 DIAGNOSIS — R41841 Cognitive communication deficit: Secondary | ICD-10-CM | POA: Diagnosis not present

## 2016-04-12 DIAGNOSIS — M6281 Muscle weakness (generalized): Secondary | ICD-10-CM | POA: Diagnosis not present

## 2016-04-12 DIAGNOSIS — F0150 Vascular dementia without behavioral disturbance: Secondary | ICD-10-CM | POA: Diagnosis not present

## 2016-04-12 DIAGNOSIS — R1312 Dysphagia, oropharyngeal phase: Secondary | ICD-10-CM | POA: Diagnosis not present

## 2016-04-13 DIAGNOSIS — F0150 Vascular dementia without behavioral disturbance: Secondary | ICD-10-CM | POA: Diagnosis not present

## 2016-04-13 DIAGNOSIS — M6281 Muscle weakness (generalized): Secondary | ICD-10-CM | POA: Diagnosis not present

## 2016-04-13 DIAGNOSIS — R1312 Dysphagia, oropharyngeal phase: Secondary | ICD-10-CM | POA: Diagnosis not present

## 2016-04-13 DIAGNOSIS — R41841 Cognitive communication deficit: Secondary | ICD-10-CM | POA: Diagnosis not present

## 2016-04-16 DIAGNOSIS — R1312 Dysphagia, oropharyngeal phase: Secondary | ICD-10-CM | POA: Diagnosis not present

## 2016-04-16 DIAGNOSIS — R41841 Cognitive communication deficit: Secondary | ICD-10-CM | POA: Diagnosis not present

## 2016-04-16 DIAGNOSIS — M79672 Pain in left foot: Secondary | ICD-10-CM | POA: Diagnosis not present

## 2016-04-16 DIAGNOSIS — B351 Tinea unguium: Secondary | ICD-10-CM | POA: Diagnosis not present

## 2016-04-16 DIAGNOSIS — M6281 Muscle weakness (generalized): Secondary | ICD-10-CM | POA: Diagnosis not present

## 2016-04-16 DIAGNOSIS — E119 Type 2 diabetes mellitus without complications: Secondary | ICD-10-CM | POA: Diagnosis not present

## 2016-04-16 DIAGNOSIS — M79671 Pain in right foot: Secondary | ICD-10-CM | POA: Diagnosis not present

## 2016-04-16 DIAGNOSIS — F0150 Vascular dementia without behavioral disturbance: Secondary | ICD-10-CM | POA: Diagnosis not present

## 2016-04-17 DIAGNOSIS — F0150 Vascular dementia without behavioral disturbance: Secondary | ICD-10-CM | POA: Diagnosis not present

## 2016-04-17 DIAGNOSIS — M6281 Muscle weakness (generalized): Secondary | ICD-10-CM | POA: Diagnosis not present

## 2016-04-17 DIAGNOSIS — R41841 Cognitive communication deficit: Secondary | ICD-10-CM | POA: Diagnosis not present

## 2016-04-17 DIAGNOSIS — R1312 Dysphagia, oropharyngeal phase: Secondary | ICD-10-CM | POA: Diagnosis not present

## 2016-04-25 DIAGNOSIS — E119 Type 2 diabetes mellitus without complications: Secondary | ICD-10-CM | POA: Diagnosis not present

## 2016-04-25 DIAGNOSIS — F0390 Unspecified dementia without behavioral disturbance: Secondary | ICD-10-CM | POA: Diagnosis not present

## 2016-04-25 DIAGNOSIS — I10 Essential (primary) hypertension: Secondary | ICD-10-CM | POA: Diagnosis not present

## 2016-05-03 DIAGNOSIS — M6281 Muscle weakness (generalized): Secondary | ICD-10-CM | POA: Diagnosis not present

## 2016-05-03 DIAGNOSIS — F0150 Vascular dementia without behavioral disturbance: Secondary | ICD-10-CM | POA: Diagnosis not present

## 2016-05-03 DIAGNOSIS — R41841 Cognitive communication deficit: Secondary | ICD-10-CM | POA: Diagnosis not present

## 2016-05-03 DIAGNOSIS — R1312 Dysphagia, oropharyngeal phase: Secondary | ICD-10-CM | POA: Diagnosis not present

## 2016-05-04 DIAGNOSIS — F0150 Vascular dementia without behavioral disturbance: Secondary | ICD-10-CM | POA: Diagnosis not present

## 2016-05-04 DIAGNOSIS — R1312 Dysphagia, oropharyngeal phase: Secondary | ICD-10-CM | POA: Diagnosis not present

## 2016-05-04 DIAGNOSIS — R41841 Cognitive communication deficit: Secondary | ICD-10-CM | POA: Diagnosis not present

## 2016-05-04 DIAGNOSIS — M6281 Muscle weakness (generalized): Secondary | ICD-10-CM | POA: Diagnosis not present

## 2016-05-07 DIAGNOSIS — R41841 Cognitive communication deficit: Secondary | ICD-10-CM | POA: Diagnosis not present

## 2016-05-07 DIAGNOSIS — R1312 Dysphagia, oropharyngeal phase: Secondary | ICD-10-CM | POA: Diagnosis not present

## 2016-05-07 DIAGNOSIS — M6281 Muscle weakness (generalized): Secondary | ICD-10-CM | POA: Diagnosis not present

## 2016-05-07 DIAGNOSIS — F0150 Vascular dementia without behavioral disturbance: Secondary | ICD-10-CM | POA: Diagnosis not present

## 2016-05-08 DIAGNOSIS — R41841 Cognitive communication deficit: Secondary | ICD-10-CM | POA: Diagnosis not present

## 2016-05-08 DIAGNOSIS — M6281 Muscle weakness (generalized): Secondary | ICD-10-CM | POA: Diagnosis not present

## 2016-05-08 DIAGNOSIS — R1312 Dysphagia, oropharyngeal phase: Secondary | ICD-10-CM | POA: Diagnosis not present

## 2016-05-08 DIAGNOSIS — F0150 Vascular dementia without behavioral disturbance: Secondary | ICD-10-CM | POA: Diagnosis not present

## 2016-05-09 DIAGNOSIS — F0150 Vascular dementia without behavioral disturbance: Secondary | ICD-10-CM | POA: Diagnosis not present

## 2016-05-09 DIAGNOSIS — M6281 Muscle weakness (generalized): Secondary | ICD-10-CM | POA: Diagnosis not present

## 2016-05-09 DIAGNOSIS — R41841 Cognitive communication deficit: Secondary | ICD-10-CM | POA: Diagnosis not present

## 2016-05-09 DIAGNOSIS — R1312 Dysphagia, oropharyngeal phase: Secondary | ICD-10-CM | POA: Diagnosis not present

## 2016-05-10 DIAGNOSIS — F0150 Vascular dementia without behavioral disturbance: Secondary | ICD-10-CM | POA: Diagnosis not present

## 2016-05-10 DIAGNOSIS — R1312 Dysphagia, oropharyngeal phase: Secondary | ICD-10-CM | POA: Diagnosis not present

## 2016-05-10 DIAGNOSIS — M6281 Muscle weakness (generalized): Secondary | ICD-10-CM | POA: Diagnosis not present

## 2016-05-10 DIAGNOSIS — R41841 Cognitive communication deficit: Secondary | ICD-10-CM | POA: Diagnosis not present

## 2016-05-11 DIAGNOSIS — R41841 Cognitive communication deficit: Secondary | ICD-10-CM | POA: Diagnosis not present

## 2016-05-11 DIAGNOSIS — M6281 Muscle weakness (generalized): Secondary | ICD-10-CM | POA: Diagnosis not present

## 2016-05-11 DIAGNOSIS — R1312 Dysphagia, oropharyngeal phase: Secondary | ICD-10-CM | POA: Diagnosis not present

## 2016-05-11 DIAGNOSIS — F0150 Vascular dementia without behavioral disturbance: Secondary | ICD-10-CM | POA: Diagnosis not present

## 2016-05-14 DIAGNOSIS — F0150 Vascular dementia without behavioral disturbance: Secondary | ICD-10-CM | POA: Diagnosis not present

## 2016-05-14 DIAGNOSIS — M6281 Muscle weakness (generalized): Secondary | ICD-10-CM | POA: Diagnosis not present

## 2016-05-15 DIAGNOSIS — M6281 Muscle weakness (generalized): Secondary | ICD-10-CM | POA: Diagnosis not present

## 2016-05-15 DIAGNOSIS — F0150 Vascular dementia without behavioral disturbance: Secondary | ICD-10-CM | POA: Diagnosis not present

## 2016-05-16 DIAGNOSIS — M6281 Muscle weakness (generalized): Secondary | ICD-10-CM | POA: Diagnosis not present

## 2016-05-16 DIAGNOSIS — F0150 Vascular dementia without behavioral disturbance: Secondary | ICD-10-CM | POA: Diagnosis not present

## 2016-05-17 DIAGNOSIS — F0150 Vascular dementia without behavioral disturbance: Secondary | ICD-10-CM | POA: Diagnosis not present

## 2016-05-17 DIAGNOSIS — M6281 Muscle weakness (generalized): Secondary | ICD-10-CM | POA: Diagnosis not present

## 2016-05-18 DIAGNOSIS — M6281 Muscle weakness (generalized): Secondary | ICD-10-CM | POA: Diagnosis not present

## 2016-05-18 DIAGNOSIS — F0150 Vascular dementia without behavioral disturbance: Secondary | ICD-10-CM | POA: Diagnosis not present

## 2016-05-21 DIAGNOSIS — M6281 Muscle weakness (generalized): Secondary | ICD-10-CM | POA: Diagnosis not present

## 2016-05-21 DIAGNOSIS — F0150 Vascular dementia without behavioral disturbance: Secondary | ICD-10-CM | POA: Diagnosis not present

## 2016-05-22 DIAGNOSIS — F0150 Vascular dementia without behavioral disturbance: Secondary | ICD-10-CM | POA: Diagnosis not present

## 2016-05-22 DIAGNOSIS — M6281 Muscle weakness (generalized): Secondary | ICD-10-CM | POA: Diagnosis not present

## 2016-05-23 DIAGNOSIS — M6281 Muscle weakness (generalized): Secondary | ICD-10-CM | POA: Diagnosis not present

## 2016-05-23 DIAGNOSIS — F0150 Vascular dementia without behavioral disturbance: Secondary | ICD-10-CM | POA: Diagnosis not present

## 2016-05-24 DIAGNOSIS — F0150 Vascular dementia without behavioral disturbance: Secondary | ICD-10-CM | POA: Diagnosis not present

## 2016-05-24 DIAGNOSIS — M6281 Muscle weakness (generalized): Secondary | ICD-10-CM | POA: Diagnosis not present

## 2016-05-25 DIAGNOSIS — F0150 Vascular dementia without behavioral disturbance: Secondary | ICD-10-CM | POA: Diagnosis not present

## 2016-05-25 DIAGNOSIS — M6281 Muscle weakness (generalized): Secondary | ICD-10-CM | POA: Diagnosis not present

## 2016-05-28 DIAGNOSIS — M6281 Muscle weakness (generalized): Secondary | ICD-10-CM | POA: Diagnosis not present

## 2016-05-28 DIAGNOSIS — F0150 Vascular dementia without behavioral disturbance: Secondary | ICD-10-CM | POA: Diagnosis not present

## 2016-05-29 DIAGNOSIS — F0150 Vascular dementia without behavioral disturbance: Secondary | ICD-10-CM | POA: Diagnosis not present

## 2016-05-29 DIAGNOSIS — M6281 Muscle weakness (generalized): Secondary | ICD-10-CM | POA: Diagnosis not present

## 2016-05-30 DIAGNOSIS — M6281 Muscle weakness (generalized): Secondary | ICD-10-CM | POA: Diagnosis not present

## 2016-05-30 DIAGNOSIS — F0150 Vascular dementia without behavioral disturbance: Secondary | ICD-10-CM | POA: Diagnosis not present

## 2016-05-31 DIAGNOSIS — F0150 Vascular dementia without behavioral disturbance: Secondary | ICD-10-CM | POA: Diagnosis not present

## 2016-05-31 DIAGNOSIS — M6281 Muscle weakness (generalized): Secondary | ICD-10-CM | POA: Diagnosis not present

## 2016-06-01 DIAGNOSIS — F0150 Vascular dementia without behavioral disturbance: Secondary | ICD-10-CM | POA: Diagnosis not present

## 2016-06-01 DIAGNOSIS — M6281 Muscle weakness (generalized): Secondary | ICD-10-CM | POA: Diagnosis not present

## 2016-06-04 DIAGNOSIS — F0150 Vascular dementia without behavioral disturbance: Secondary | ICD-10-CM | POA: Diagnosis not present

## 2016-06-04 DIAGNOSIS — M6281 Muscle weakness (generalized): Secondary | ICD-10-CM | POA: Diagnosis not present

## 2016-06-05 DIAGNOSIS — F0150 Vascular dementia without behavioral disturbance: Secondary | ICD-10-CM | POA: Diagnosis not present

## 2016-06-05 DIAGNOSIS — M6281 Muscle weakness (generalized): Secondary | ICD-10-CM | POA: Diagnosis not present

## 2016-06-06 DIAGNOSIS — F0150 Vascular dementia without behavioral disturbance: Secondary | ICD-10-CM | POA: Diagnosis not present

## 2016-06-06 DIAGNOSIS — M6281 Muscle weakness (generalized): Secondary | ICD-10-CM | POA: Diagnosis not present

## 2016-06-07 DIAGNOSIS — F0150 Vascular dementia without behavioral disturbance: Secondary | ICD-10-CM | POA: Diagnosis not present

## 2016-06-07 DIAGNOSIS — M6281 Muscle weakness (generalized): Secondary | ICD-10-CM | POA: Diagnosis not present

## 2016-06-08 DIAGNOSIS — M6281 Muscle weakness (generalized): Secondary | ICD-10-CM | POA: Diagnosis not present

## 2016-06-08 DIAGNOSIS — F0150 Vascular dementia without behavioral disturbance: Secondary | ICD-10-CM | POA: Diagnosis not present

## 2016-06-11 DIAGNOSIS — F0150 Vascular dementia without behavioral disturbance: Secondary | ICD-10-CM | POA: Diagnosis not present

## 2016-06-11 DIAGNOSIS — M6281 Muscle weakness (generalized): Secondary | ICD-10-CM | POA: Diagnosis not present

## 2016-06-12 DIAGNOSIS — F0150 Vascular dementia without behavioral disturbance: Secondary | ICD-10-CM | POA: Diagnosis not present

## 2016-06-12 DIAGNOSIS — M6281 Muscle weakness (generalized): Secondary | ICD-10-CM | POA: Diagnosis not present

## 2016-06-13 DIAGNOSIS — F0150 Vascular dementia without behavioral disturbance: Secondary | ICD-10-CM | POA: Diagnosis not present

## 2016-06-13 DIAGNOSIS — M6281 Muscle weakness (generalized): Secondary | ICD-10-CM | POA: Diagnosis not present

## 2016-06-14 DIAGNOSIS — M6281 Muscle weakness (generalized): Secondary | ICD-10-CM | POA: Diagnosis not present

## 2016-06-14 DIAGNOSIS — F0150 Vascular dementia without behavioral disturbance: Secondary | ICD-10-CM | POA: Diagnosis not present

## 2016-06-15 DIAGNOSIS — F0150 Vascular dementia without behavioral disturbance: Secondary | ICD-10-CM | POA: Diagnosis not present

## 2016-06-15 DIAGNOSIS — M6281 Muscle weakness (generalized): Secondary | ICD-10-CM | POA: Diagnosis not present

## 2016-06-18 DIAGNOSIS — M6281 Muscle weakness (generalized): Secondary | ICD-10-CM | POA: Diagnosis not present

## 2016-06-18 DIAGNOSIS — F0150 Vascular dementia without behavioral disturbance: Secondary | ICD-10-CM | POA: Diagnosis not present

## 2016-06-19 DIAGNOSIS — F0150 Vascular dementia without behavioral disturbance: Secondary | ICD-10-CM | POA: Diagnosis not present

## 2016-06-19 DIAGNOSIS — M6281 Muscle weakness (generalized): Secondary | ICD-10-CM | POA: Diagnosis not present

## 2016-06-20 DIAGNOSIS — M6281 Muscle weakness (generalized): Secondary | ICD-10-CM | POA: Diagnosis not present

## 2016-06-20 DIAGNOSIS — F0150 Vascular dementia without behavioral disturbance: Secondary | ICD-10-CM | POA: Diagnosis not present

## 2016-06-21 DIAGNOSIS — M6281 Muscle weakness (generalized): Secondary | ICD-10-CM | POA: Diagnosis not present

## 2016-06-21 DIAGNOSIS — F0150 Vascular dementia without behavioral disturbance: Secondary | ICD-10-CM | POA: Diagnosis not present

## 2016-06-23 DIAGNOSIS — M6281 Muscle weakness (generalized): Secondary | ICD-10-CM | POA: Diagnosis not present

## 2016-06-23 DIAGNOSIS — F0150 Vascular dementia without behavioral disturbance: Secondary | ICD-10-CM | POA: Diagnosis not present

## 2016-06-25 DIAGNOSIS — M6281 Muscle weakness (generalized): Secondary | ICD-10-CM | POA: Diagnosis not present

## 2016-06-25 DIAGNOSIS — F0150 Vascular dementia without behavioral disturbance: Secondary | ICD-10-CM | POA: Diagnosis not present

## 2016-06-26 DIAGNOSIS — F0150 Vascular dementia without behavioral disturbance: Secondary | ICD-10-CM | POA: Diagnosis not present

## 2016-06-26 DIAGNOSIS — M6281 Muscle weakness (generalized): Secondary | ICD-10-CM | POA: Diagnosis not present

## 2016-06-27 DIAGNOSIS — M6281 Muscle weakness (generalized): Secondary | ICD-10-CM | POA: Diagnosis not present

## 2016-06-27 DIAGNOSIS — F0150 Vascular dementia without behavioral disturbance: Secondary | ICD-10-CM | POA: Diagnosis not present

## 2016-06-29 DIAGNOSIS — F0150 Vascular dementia without behavioral disturbance: Secondary | ICD-10-CM | POA: Diagnosis not present

## 2016-06-29 DIAGNOSIS — M6281 Muscle weakness (generalized): Secondary | ICD-10-CM | POA: Diagnosis not present

## 2016-06-30 DIAGNOSIS — M6281 Muscle weakness (generalized): Secondary | ICD-10-CM | POA: Diagnosis not present

## 2016-06-30 DIAGNOSIS — F0150 Vascular dementia without behavioral disturbance: Secondary | ICD-10-CM | POA: Diagnosis not present

## 2016-07-02 DIAGNOSIS — M6281 Muscle weakness (generalized): Secondary | ICD-10-CM | POA: Diagnosis not present

## 2016-07-02 DIAGNOSIS — F0150 Vascular dementia without behavioral disturbance: Secondary | ICD-10-CM | POA: Diagnosis not present

## 2016-07-03 DIAGNOSIS — M6281 Muscle weakness (generalized): Secondary | ICD-10-CM | POA: Diagnosis not present

## 2016-07-03 DIAGNOSIS — F0150 Vascular dementia without behavioral disturbance: Secondary | ICD-10-CM | POA: Diagnosis not present

## 2016-07-04 DIAGNOSIS — M6281 Muscle weakness (generalized): Secondary | ICD-10-CM | POA: Diagnosis not present

## 2016-07-04 DIAGNOSIS — F0150 Vascular dementia without behavioral disturbance: Secondary | ICD-10-CM | POA: Diagnosis not present

## 2016-07-05 DIAGNOSIS — F0150 Vascular dementia without behavioral disturbance: Secondary | ICD-10-CM | POA: Diagnosis not present

## 2016-07-05 DIAGNOSIS — M6281 Muscle weakness (generalized): Secondary | ICD-10-CM | POA: Diagnosis not present

## 2016-07-06 DIAGNOSIS — M6281 Muscle weakness (generalized): Secondary | ICD-10-CM | POA: Diagnosis not present

## 2016-07-06 DIAGNOSIS — F0150 Vascular dementia without behavioral disturbance: Secondary | ICD-10-CM | POA: Diagnosis not present

## 2016-07-07 DIAGNOSIS — F0150 Vascular dementia without behavioral disturbance: Secondary | ICD-10-CM | POA: Diagnosis not present

## 2016-07-07 DIAGNOSIS — M6281 Muscle weakness (generalized): Secondary | ICD-10-CM | POA: Diagnosis not present

## 2016-07-09 DIAGNOSIS — M6281 Muscle weakness (generalized): Secondary | ICD-10-CM | POA: Diagnosis not present

## 2016-07-09 DIAGNOSIS — F0150 Vascular dementia without behavioral disturbance: Secondary | ICD-10-CM | POA: Diagnosis not present

## 2016-07-10 DIAGNOSIS — F0150 Vascular dementia without behavioral disturbance: Secondary | ICD-10-CM | POA: Diagnosis not present

## 2016-07-10 DIAGNOSIS — M6281 Muscle weakness (generalized): Secondary | ICD-10-CM | POA: Diagnosis not present

## 2016-07-11 DIAGNOSIS — F0150 Vascular dementia without behavioral disturbance: Secondary | ICD-10-CM | POA: Diagnosis not present

## 2016-07-11 DIAGNOSIS — M6281 Muscle weakness (generalized): Secondary | ICD-10-CM | POA: Diagnosis not present

## 2016-07-12 DIAGNOSIS — F0150 Vascular dementia without behavioral disturbance: Secondary | ICD-10-CM | POA: Diagnosis not present

## 2016-07-12 DIAGNOSIS — M6281 Muscle weakness (generalized): Secondary | ICD-10-CM | POA: Diagnosis not present

## 2016-07-13 DIAGNOSIS — M6281 Muscle weakness (generalized): Secondary | ICD-10-CM | POA: Diagnosis not present

## 2016-07-13 DIAGNOSIS — F0150 Vascular dementia without behavioral disturbance: Secondary | ICD-10-CM | POA: Diagnosis not present

## 2016-07-13 DIAGNOSIS — R1312 Dysphagia, oropharyngeal phase: Secondary | ICD-10-CM | POA: Diagnosis not present

## 2016-07-16 DIAGNOSIS — R1312 Dysphagia, oropharyngeal phase: Secondary | ICD-10-CM | POA: Diagnosis not present

## 2016-07-16 DIAGNOSIS — M6281 Muscle weakness (generalized): Secondary | ICD-10-CM | POA: Diagnosis not present

## 2016-07-16 DIAGNOSIS — F0150 Vascular dementia without behavioral disturbance: Secondary | ICD-10-CM | POA: Diagnosis not present

## 2016-07-17 DIAGNOSIS — F0150 Vascular dementia without behavioral disturbance: Secondary | ICD-10-CM | POA: Diagnosis not present

## 2016-07-17 DIAGNOSIS — R1312 Dysphagia, oropharyngeal phase: Secondary | ICD-10-CM | POA: Diagnosis not present

## 2016-07-17 DIAGNOSIS — M6281 Muscle weakness (generalized): Secondary | ICD-10-CM | POA: Diagnosis not present

## 2016-07-18 DIAGNOSIS — M6281 Muscle weakness (generalized): Secondary | ICD-10-CM | POA: Diagnosis not present

## 2016-07-18 DIAGNOSIS — R1312 Dysphagia, oropharyngeal phase: Secondary | ICD-10-CM | POA: Diagnosis not present

## 2016-07-18 DIAGNOSIS — F0150 Vascular dementia without behavioral disturbance: Secondary | ICD-10-CM | POA: Diagnosis not present

## 2016-08-01 DIAGNOSIS — I10 Essential (primary) hypertension: Secondary | ICD-10-CM | POA: Diagnosis not present

## 2016-08-01 DIAGNOSIS — F0390 Unspecified dementia without behavioral disturbance: Secondary | ICD-10-CM | POA: Diagnosis not present

## 2016-08-01 DIAGNOSIS — E7800 Pure hypercholesterolemia, unspecified: Secondary | ICD-10-CM | POA: Diagnosis not present

## 2016-08-01 DIAGNOSIS — E119 Type 2 diabetes mellitus without complications: Secondary | ICD-10-CM | POA: Diagnosis not present

## 2016-08-14 DIAGNOSIS — M175 Other unilateral secondary osteoarthritis of knee: Secondary | ICD-10-CM | POA: Diagnosis not present

## 2016-08-14 DIAGNOSIS — M6281 Muscle weakness (generalized): Secondary | ICD-10-CM | POA: Diagnosis not present

## 2016-08-14 DIAGNOSIS — F0150 Vascular dementia without behavioral disturbance: Secondary | ICD-10-CM | POA: Diagnosis not present

## 2016-08-14 DIAGNOSIS — R262 Difficulty in walking, not elsewhere classified: Secondary | ICD-10-CM | POA: Diagnosis not present

## 2016-08-15 DIAGNOSIS — R262 Difficulty in walking, not elsewhere classified: Secondary | ICD-10-CM | POA: Diagnosis not present

## 2016-08-15 DIAGNOSIS — M6281 Muscle weakness (generalized): Secondary | ICD-10-CM | POA: Diagnosis not present

## 2016-08-15 DIAGNOSIS — R062 Wheezing: Secondary | ICD-10-CM | POA: Diagnosis not present

## 2016-08-15 DIAGNOSIS — R0602 Shortness of breath: Secondary | ICD-10-CM | POA: Diagnosis not present

## 2016-08-15 DIAGNOSIS — R0600 Dyspnea, unspecified: Secondary | ICD-10-CM | POA: Diagnosis not present

## 2016-08-15 DIAGNOSIS — M175 Other unilateral secondary osteoarthritis of knee: Secondary | ICD-10-CM | POA: Diagnosis not present

## 2016-08-15 DIAGNOSIS — F0150 Vascular dementia without behavioral disturbance: Secondary | ICD-10-CM | POA: Diagnosis not present

## 2016-08-16 DIAGNOSIS — R262 Difficulty in walking, not elsewhere classified: Secondary | ICD-10-CM | POA: Diagnosis not present

## 2016-08-16 DIAGNOSIS — I70203 Unspecified atherosclerosis of native arteries of extremities, bilateral legs: Secondary | ICD-10-CM | POA: Diagnosis not present

## 2016-08-16 DIAGNOSIS — F0150 Vascular dementia without behavioral disturbance: Secondary | ICD-10-CM | POA: Diagnosis not present

## 2016-08-16 DIAGNOSIS — M79674 Pain in right toe(s): Secondary | ICD-10-CM | POA: Diagnosis not present

## 2016-08-16 DIAGNOSIS — M6281 Muscle weakness (generalized): Secondary | ICD-10-CM | POA: Diagnosis not present

## 2016-08-16 DIAGNOSIS — E119 Type 2 diabetes mellitus without complications: Secondary | ICD-10-CM | POA: Diagnosis not present

## 2016-08-16 DIAGNOSIS — M175 Other unilateral secondary osteoarthritis of knee: Secondary | ICD-10-CM | POA: Diagnosis not present

## 2016-08-16 DIAGNOSIS — B351 Tinea unguium: Secondary | ICD-10-CM | POA: Diagnosis not present

## 2016-08-17 DIAGNOSIS — R262 Difficulty in walking, not elsewhere classified: Secondary | ICD-10-CM | POA: Diagnosis not present

## 2016-08-17 DIAGNOSIS — M6281 Muscle weakness (generalized): Secondary | ICD-10-CM | POA: Diagnosis not present

## 2016-08-17 DIAGNOSIS — F0150 Vascular dementia without behavioral disturbance: Secondary | ICD-10-CM | POA: Diagnosis not present

## 2016-08-17 DIAGNOSIS — M175 Other unilateral secondary osteoarthritis of knee: Secondary | ICD-10-CM | POA: Diagnosis not present

## 2016-08-20 DIAGNOSIS — M175 Other unilateral secondary osteoarthritis of knee: Secondary | ICD-10-CM | POA: Diagnosis not present

## 2016-08-20 DIAGNOSIS — R262 Difficulty in walking, not elsewhere classified: Secondary | ICD-10-CM | POA: Diagnosis not present

## 2016-08-20 DIAGNOSIS — M6281 Muscle weakness (generalized): Secondary | ICD-10-CM | POA: Diagnosis not present

## 2016-08-20 DIAGNOSIS — F0150 Vascular dementia without behavioral disturbance: Secondary | ICD-10-CM | POA: Diagnosis not present

## 2016-08-21 DIAGNOSIS — R262 Difficulty in walking, not elsewhere classified: Secondary | ICD-10-CM | POA: Diagnosis not present

## 2016-08-21 DIAGNOSIS — F0150 Vascular dementia without behavioral disturbance: Secondary | ICD-10-CM | POA: Diagnosis not present

## 2016-08-21 DIAGNOSIS — M175 Other unilateral secondary osteoarthritis of knee: Secondary | ICD-10-CM | POA: Diagnosis not present

## 2016-08-21 DIAGNOSIS — E119 Type 2 diabetes mellitus without complications: Secondary | ICD-10-CM | POA: Diagnosis not present

## 2016-08-21 DIAGNOSIS — H25813 Combined forms of age-related cataract, bilateral: Secondary | ICD-10-CM | POA: Diagnosis not present

## 2016-08-21 DIAGNOSIS — M6281 Muscle weakness (generalized): Secondary | ICD-10-CM | POA: Diagnosis not present

## 2016-08-22 DIAGNOSIS — R262 Difficulty in walking, not elsewhere classified: Secondary | ICD-10-CM | POA: Diagnosis not present

## 2016-08-22 DIAGNOSIS — F0150 Vascular dementia without behavioral disturbance: Secondary | ICD-10-CM | POA: Diagnosis not present

## 2016-08-22 DIAGNOSIS — M175 Other unilateral secondary osteoarthritis of knee: Secondary | ICD-10-CM | POA: Diagnosis not present

## 2016-08-22 DIAGNOSIS — M6281 Muscle weakness (generalized): Secondary | ICD-10-CM | POA: Diagnosis not present

## 2016-08-23 DIAGNOSIS — R262 Difficulty in walking, not elsewhere classified: Secondary | ICD-10-CM | POA: Diagnosis not present

## 2016-08-23 DIAGNOSIS — F0150 Vascular dementia without behavioral disturbance: Secondary | ICD-10-CM | POA: Diagnosis not present

## 2016-08-23 DIAGNOSIS — M175 Other unilateral secondary osteoarthritis of knee: Secondary | ICD-10-CM | POA: Diagnosis not present

## 2016-08-23 DIAGNOSIS — M6281 Muscle weakness (generalized): Secondary | ICD-10-CM | POA: Diagnosis not present

## 2016-08-24 DIAGNOSIS — R262 Difficulty in walking, not elsewhere classified: Secondary | ICD-10-CM | POA: Diagnosis not present

## 2016-08-24 DIAGNOSIS — F0150 Vascular dementia without behavioral disturbance: Secondary | ICD-10-CM | POA: Diagnosis not present

## 2016-08-24 DIAGNOSIS — M175 Other unilateral secondary osteoarthritis of knee: Secondary | ICD-10-CM | POA: Diagnosis not present

## 2016-08-24 DIAGNOSIS — M6281 Muscle weakness (generalized): Secondary | ICD-10-CM | POA: Diagnosis not present

## 2016-08-26 DIAGNOSIS — M6281 Muscle weakness (generalized): Secondary | ICD-10-CM | POA: Diagnosis not present

## 2016-08-26 DIAGNOSIS — M175 Other unilateral secondary osteoarthritis of knee: Secondary | ICD-10-CM | POA: Diagnosis not present

## 2016-08-26 DIAGNOSIS — R262 Difficulty in walking, not elsewhere classified: Secondary | ICD-10-CM | POA: Diagnosis not present

## 2016-08-26 DIAGNOSIS — F0150 Vascular dementia without behavioral disturbance: Secondary | ICD-10-CM | POA: Diagnosis not present

## 2016-08-27 DIAGNOSIS — F0150 Vascular dementia without behavioral disturbance: Secondary | ICD-10-CM | POA: Diagnosis not present

## 2016-08-27 DIAGNOSIS — M6281 Muscle weakness (generalized): Secondary | ICD-10-CM | POA: Diagnosis not present

## 2016-08-27 DIAGNOSIS — R262 Difficulty in walking, not elsewhere classified: Secondary | ICD-10-CM | POA: Diagnosis not present

## 2016-08-27 DIAGNOSIS — M175 Other unilateral secondary osteoarthritis of knee: Secondary | ICD-10-CM | POA: Diagnosis not present

## 2016-08-28 DIAGNOSIS — R262 Difficulty in walking, not elsewhere classified: Secondary | ICD-10-CM | POA: Diagnosis not present

## 2016-08-28 DIAGNOSIS — M6281 Muscle weakness (generalized): Secondary | ICD-10-CM | POA: Diagnosis not present

## 2016-08-28 DIAGNOSIS — M175 Other unilateral secondary osteoarthritis of knee: Secondary | ICD-10-CM | POA: Diagnosis not present

## 2016-08-28 DIAGNOSIS — F0150 Vascular dementia without behavioral disturbance: Secondary | ICD-10-CM | POA: Diagnosis not present

## 2016-08-29 DIAGNOSIS — M79621 Pain in right upper arm: Secondary | ICD-10-CM | POA: Diagnosis not present

## 2016-08-29 DIAGNOSIS — M79641 Pain in right hand: Secondary | ICD-10-CM | POA: Diagnosis not present

## 2016-08-29 DIAGNOSIS — M79672 Pain in left foot: Secondary | ICD-10-CM | POA: Diagnosis not present

## 2016-08-29 DIAGNOSIS — M79651 Pain in right thigh: Secondary | ICD-10-CM | POA: Diagnosis not present

## 2016-08-29 DIAGNOSIS — S60221A Contusion of right hand, initial encounter: Secondary | ICD-10-CM | POA: Diagnosis not present

## 2016-08-29 DIAGNOSIS — M79631 Pain in right forearm: Secondary | ICD-10-CM | POA: Diagnosis not present

## 2016-08-29 DIAGNOSIS — M542 Cervicalgia: Secondary | ICD-10-CM | POA: Diagnosis not present

## 2016-08-29 DIAGNOSIS — W19XXXA Unspecified fall, initial encounter: Secondary | ICD-10-CM | POA: Diagnosis not present

## 2016-08-29 DIAGNOSIS — M25562 Pain in left knee: Secondary | ICD-10-CM | POA: Diagnosis not present

## 2016-08-30 DIAGNOSIS — R262 Difficulty in walking, not elsewhere classified: Secondary | ICD-10-CM | POA: Diagnosis not present

## 2016-08-30 DIAGNOSIS — M175 Other unilateral secondary osteoarthritis of knee: Secondary | ICD-10-CM | POA: Diagnosis not present

## 2016-08-30 DIAGNOSIS — M6281 Muscle weakness (generalized): Secondary | ICD-10-CM | POA: Diagnosis not present

## 2016-08-30 DIAGNOSIS — F0150 Vascular dementia without behavioral disturbance: Secondary | ICD-10-CM | POA: Diagnosis not present

## 2016-08-31 DIAGNOSIS — M175 Other unilateral secondary osteoarthritis of knee: Secondary | ICD-10-CM | POA: Diagnosis not present

## 2016-08-31 DIAGNOSIS — F0150 Vascular dementia without behavioral disturbance: Secondary | ICD-10-CM | POA: Diagnosis not present

## 2016-08-31 DIAGNOSIS — R262 Difficulty in walking, not elsewhere classified: Secondary | ICD-10-CM | POA: Diagnosis not present

## 2016-08-31 DIAGNOSIS — M6281 Muscle weakness (generalized): Secondary | ICD-10-CM | POA: Diagnosis not present

## 2016-09-03 DIAGNOSIS — R262 Difficulty in walking, not elsewhere classified: Secondary | ICD-10-CM | POA: Diagnosis not present

## 2016-09-03 DIAGNOSIS — F0150 Vascular dementia without behavioral disturbance: Secondary | ICD-10-CM | POA: Diagnosis not present

## 2016-09-03 DIAGNOSIS — M175 Other unilateral secondary osteoarthritis of knee: Secondary | ICD-10-CM | POA: Diagnosis not present

## 2016-09-03 DIAGNOSIS — M6281 Muscle weakness (generalized): Secondary | ICD-10-CM | POA: Diagnosis not present

## 2016-09-04 DIAGNOSIS — F0150 Vascular dementia without behavioral disturbance: Secondary | ICD-10-CM | POA: Diagnosis not present

## 2016-09-04 DIAGNOSIS — R262 Difficulty in walking, not elsewhere classified: Secondary | ICD-10-CM | POA: Diagnosis not present

## 2016-09-04 DIAGNOSIS — M6281 Muscle weakness (generalized): Secondary | ICD-10-CM | POA: Diagnosis not present

## 2016-09-04 DIAGNOSIS — M175 Other unilateral secondary osteoarthritis of knee: Secondary | ICD-10-CM | POA: Diagnosis not present

## 2016-09-05 DIAGNOSIS — R262 Difficulty in walking, not elsewhere classified: Secondary | ICD-10-CM | POA: Diagnosis not present

## 2016-09-05 DIAGNOSIS — F0150 Vascular dementia without behavioral disturbance: Secondary | ICD-10-CM | POA: Diagnosis not present

## 2016-09-05 DIAGNOSIS — M6281 Muscle weakness (generalized): Secondary | ICD-10-CM | POA: Diagnosis not present

## 2016-09-05 DIAGNOSIS — M175 Other unilateral secondary osteoarthritis of knee: Secondary | ICD-10-CM | POA: Diagnosis not present

## 2016-09-06 DIAGNOSIS — M175 Other unilateral secondary osteoarthritis of knee: Secondary | ICD-10-CM | POA: Diagnosis not present

## 2016-09-06 DIAGNOSIS — F0150 Vascular dementia without behavioral disturbance: Secondary | ICD-10-CM | POA: Diagnosis not present

## 2016-09-06 DIAGNOSIS — R262 Difficulty in walking, not elsewhere classified: Secondary | ICD-10-CM | POA: Diagnosis not present

## 2016-09-06 DIAGNOSIS — M6281 Muscle weakness (generalized): Secondary | ICD-10-CM | POA: Diagnosis not present

## 2016-09-07 DIAGNOSIS — M175 Other unilateral secondary osteoarthritis of knee: Secondary | ICD-10-CM | POA: Diagnosis not present

## 2016-09-07 DIAGNOSIS — R262 Difficulty in walking, not elsewhere classified: Secondary | ICD-10-CM | POA: Diagnosis not present

## 2016-09-07 DIAGNOSIS — M6281 Muscle weakness (generalized): Secondary | ICD-10-CM | POA: Diagnosis not present

## 2016-09-07 DIAGNOSIS — F0150 Vascular dementia without behavioral disturbance: Secondary | ICD-10-CM | POA: Diagnosis not present

## 2016-09-10 DIAGNOSIS — M175 Other unilateral secondary osteoarthritis of knee: Secondary | ICD-10-CM | POA: Diagnosis not present

## 2016-09-10 DIAGNOSIS — M6281 Muscle weakness (generalized): Secondary | ICD-10-CM | POA: Diagnosis not present

## 2016-09-10 DIAGNOSIS — R262 Difficulty in walking, not elsewhere classified: Secondary | ICD-10-CM | POA: Diagnosis not present

## 2016-09-10 DIAGNOSIS — F0150 Vascular dementia without behavioral disturbance: Secondary | ICD-10-CM | POA: Diagnosis not present

## 2016-09-23 DIAGNOSIS — R0989 Other specified symptoms and signs involving the circulatory and respiratory systems: Secondary | ICD-10-CM | POA: Diagnosis not present

## 2016-10-17 DIAGNOSIS — F0390 Unspecified dementia without behavioral disturbance: Secondary | ICD-10-CM | POA: Diagnosis not present

## 2016-10-17 DIAGNOSIS — I10 Essential (primary) hypertension: Secondary | ICD-10-CM | POA: Diagnosis not present

## 2016-10-17 DIAGNOSIS — E7800 Pure hypercholesterolemia, unspecified: Secondary | ICD-10-CM | POA: Diagnosis not present

## 2016-10-17 DIAGNOSIS — E079 Disorder of thyroid, unspecified: Secondary | ICD-10-CM | POA: Diagnosis not present

## 2016-12-14 DIAGNOSIS — M79675 Pain in left toe(s): Secondary | ICD-10-CM | POA: Diagnosis not present

## 2016-12-14 DIAGNOSIS — I70203 Unspecified atherosclerosis of native arteries of extremities, bilateral legs: Secondary | ICD-10-CM | POA: Diagnosis not present

## 2016-12-14 DIAGNOSIS — B351 Tinea unguium: Secondary | ICD-10-CM | POA: Diagnosis not present

## 2016-12-14 DIAGNOSIS — M79674 Pain in right toe(s): Secondary | ICD-10-CM | POA: Diagnosis not present

## 2017-01-15 DIAGNOSIS — E119 Type 2 diabetes mellitus without complications: Secondary | ICD-10-CM | POA: Diagnosis not present

## 2017-08-26 DIAGNOSIS — M2041 Other hammer toe(s) (acquired), right foot: Secondary | ICD-10-CM | POA: Diagnosis not present

## 2017-08-26 DIAGNOSIS — B351 Tinea unguium: Secondary | ICD-10-CM | POA: Diagnosis not present

## 2017-08-26 DIAGNOSIS — I739 Peripheral vascular disease, unspecified: Secondary | ICD-10-CM | POA: Diagnosis not present

## 2017-08-26 DIAGNOSIS — M2042 Other hammer toe(s) (acquired), left foot: Secondary | ICD-10-CM | POA: Diagnosis not present

## 2018-02-10 DEATH — deceased
